# Patient Record
Sex: Female | Born: 1990 | Race: White | Hispanic: No | Marital: Married | Smoking: Never smoker
Health system: Southern US, Community
[De-identification: ages and names within clinical notes are randomized; demographics above are authoritative.]

## PROBLEM LIST (undated history)

## (undated) DIAGNOSIS — F338 Other recurrent depressive disorders: Secondary | ICD-10-CM

## (undated) DIAGNOSIS — G4489 Other headache syndrome: Secondary | ICD-10-CM

## (undated) DIAGNOSIS — G43909 Migraine, unspecified, not intractable, without status migrainosus: Secondary | ICD-10-CM

## (undated) DIAGNOSIS — F32A Depression, unspecified: Secondary | ICD-10-CM

## (undated) DIAGNOSIS — F419 Anxiety disorder, unspecified: Secondary | ICD-10-CM

## (undated) DIAGNOSIS — R102 Pelvic and perineal pain unspecified side: Secondary | ICD-10-CM

## (undated) DIAGNOSIS — K219 Gastro-esophageal reflux disease without esophagitis: Secondary | ICD-10-CM

## (undated) DIAGNOSIS — R5383 Other fatigue: Secondary | ICD-10-CM

## (undated) DIAGNOSIS — N809 Endometriosis, unspecified: Secondary | ICD-10-CM

## (undated) DIAGNOSIS — R002 Palpitations: Secondary | ICD-10-CM

## (undated) DIAGNOSIS — K589 Irritable bowel syndrome without diarrhea: Secondary | ICD-10-CM

## (undated) DIAGNOSIS — M419 Scoliosis, unspecified: Secondary | ICD-10-CM

## (undated) DIAGNOSIS — E119 Type 2 diabetes mellitus without complications: Secondary | ICD-10-CM

## (undated) DIAGNOSIS — E282 Polycystic ovarian syndrome: Secondary | ICD-10-CM

## (undated) DIAGNOSIS — F329 Major depressive disorder, single episode, unspecified: Secondary | ICD-10-CM

## (undated) DIAGNOSIS — Z973 Presence of spectacles and contact lenses: Secondary | ICD-10-CM

## (undated) DIAGNOSIS — K900 Celiac disease: Secondary | ICD-10-CM

## (undated) DIAGNOSIS — D509 Iron deficiency anemia, unspecified: Secondary | ICD-10-CM

## (undated) HISTORY — DX: Type 2 diabetes mellitus without complications: E11.9

## (undated) HISTORY — DX: Other recurrent depressive disorders: F33.8

## (undated) HISTORY — DX: Scoliosis, unspecified: M41.9

## (undated) HISTORY — DX: Iron deficiency anemia, unspecified: D50.9

## (undated) HISTORY — DX: Other fatigue: R53.83

## (undated) HISTORY — DX: Anxiety disorder, unspecified: F41.9

## (undated) HISTORY — DX: Depression, unspecified: F32.A

## (undated) HISTORY — DX: Migraine, unspecified, not intractable, without status migrainosus: G43.909

## (undated) HISTORY — DX: Major depressive disorder, single episode, unspecified: F32.9

## (undated) HISTORY — DX: Palpitations: R00.2

## (undated) HISTORY — DX: Other headache syndrome: G44.89

## (undated) HISTORY — PX: WISDOM TOOTH EXTRACTION: SHX21

## (undated) HISTORY — PX: CHOLECYSTECTOMY: SHX55

---

## 2010-02-04 NOTE — L&D Delivery Note (Signed)
Delivery Note At 1:58 AM a viable female was delivered via Vaginal, Spontaneous Delivery (Presentation:Vtx, ROA ;  ).  APGAR: 9, 9; weight 7 lb 3.3 oz (3269 g).   Placenta status: Intact, Spontaneous.  Cord: 3 vessels with the following complications: None.  Cord pH: done but couldn't be read  Anesthesia: Local  Episiotomy: None Lacerations: 2nd degree perineal Suture Repair: 2.0 chromic Est. Blood Loss (mL):   Mom to postpartum.  Baby to nursery-stable.  Aleisa Howk C 10/31/2010, 2:23 AM

## 2010-04-03 LAB — HIV ANTIBODY (ROUTINE TESTING W REFLEX): HIV: NONREACTIVE

## 2010-04-03 LAB — ABO/RH: RH Type: POSITIVE

## 2010-04-03 LAB — CBC: Platelets: 269 10*3/uL (ref 150–399)

## 2010-04-03 LAB — GC/CHLAMYDIA PROBE AMP, GENITAL: Gonorrhea: NEGATIVE

## 2010-07-13 DIAGNOSIS — O479 False labor, unspecified: Secondary | ICD-10-CM

## 2010-10-26 ENCOUNTER — Inpatient Hospital Stay (HOSPITAL_COMMUNITY)
Admission: AD | Admit: 2010-10-26 | Discharge: 2010-10-26 | Disposition: A | Discharge status: Home / Self Care | Payer: Medicaid Other | Source: Ambulatory Visit | Attending: Obstetrics and Gynecology | Admitting: Obstetrics and Gynecology

## 2010-10-26 ENCOUNTER — Encounter (HOSPITAL_COMMUNITY): Payer: Self-pay

## 2010-10-26 DIAGNOSIS — O479 False labor, unspecified: Secondary | ICD-10-CM

## 2010-10-26 DIAGNOSIS — O4700 False labor before 37 completed weeks of gestation, unspecified trimester: Secondary | ICD-10-CM | POA: Insufficient documentation

## 2010-10-26 NOTE — Progress Notes (Signed)
Pt state she has been having low back and low abdominal pain today on and off. Has had an increase  In her discharge. Reports good fetal movement.

## 2010-10-26 NOTE — ED Notes (Signed)
Malcolm Metro CNM in room with pt

## 2010-10-26 NOTE — ED Provider Notes (Signed)
Chief Complaint:  Abdominal Pain   Stephanie Acosta is  20 y.o. G1P0. [redacted]w[redacted]d She presents complaining of Abdominal Pain and increased mucous discharge. States she called the triage nurse at MD office and was instructed to come to MAU for evaluation.  Obstetrical/Gynecological History: OB History    Grav Para Term Preterm Abortions TAB SAB Ect Mult Living   1               Past Medical History: Past Medical History  Diagnosis Date  . No pertinent past medical history     Past Surgical History: Past Surgical History  Procedure Date  . No past surgeries     Family History: Family History  Problem Relation Age of Onset  . Diabetes Maternal Grandfather   . Diabetes Paternal Grandmother     Social History: History  Substance Use Topics  . Smoking status: Never Smoker   . Smokeless tobacco: Not on file  . Alcohol Use: No    Allergies:  Allergies  Allergen Reactions  . Sulfa Antibiotics Rash    Childhood reaction    Prescriptions prior to admission  Medication Sig Dispense Refill  . omeprazole (PRILOSEC OTC) 20 MG tablet Take 20 mg by mouth at bedtime.        . prenatal vitamin w/FE, FA (PRENATAL 1 + 1) 27-1 MG TABS Take 1 tablet by mouth at bedtime.          Review of Systems - Negative except what has been reviewed in the HPI  Physical Exam   Blood pressure 122/75, pulse 93, temperature 98.2 F (36.8 C), temperature source Oral, resp. rate 20, height 5' 4"  (1.626 m), weight 86.909 kg (191 lb 9.6 oz), SpO2 97.00%.  General: General appearance - alert, well appearing, and in no distress, oriented to person, place, and time and overweight Mental status - alert, oriented to person, place, and time, normal mood, behavior, speech, dress, motor activity, and thought processes, affect appropriate to mood Focused Gynecological Exam: CERVIX: 1.5/50/soft/vtx/-3 + mucous on glove with exam, c/w loss of plug  MD Consult: Discussed with Dr. TGertie Fey Assessment: G1P0 @  351w4dalse Labor Fetal testing c/w exam  Plan: Discharge home with labor instructions  Oakes Mccready E. 10/26/2010,4:26 PM

## 2010-10-31 ENCOUNTER — Encounter (HOSPITAL_COMMUNITY): Payer: Self-pay | Admitting: *Deleted

## 2010-10-31 ENCOUNTER — Inpatient Hospital Stay (HOSPITAL_COMMUNITY)
Admission: AD | Admit: 2010-10-31 | Discharge: 2010-11-01 | DRG: 775 | Disposition: A | Discharge status: Home / Self Care | Payer: Managed Care, Other (non HMO) | Source: Ambulatory Visit | Attending: Obstetrics and Gynecology | Admitting: Obstetrics and Gynecology

## 2010-10-31 LAB — ABO/RH: ABO/RH(D): O POS

## 2010-10-31 LAB — STREP B DNA PROBE: GBS: NEGATIVE

## 2010-10-31 LAB — CBC
HCT: 33.9 % — ABNORMAL LOW (ref 36.0–46.0)
Hemoglobin: 10.8 g/dL — ABNORMAL LOW (ref 12.0–15.0)
WBC: 15.3 10*3/uL — ABNORMAL HIGH (ref 4.0–10.5)

## 2010-10-31 LAB — RPR: RPR Ser Ql: NONREACTIVE

## 2010-10-31 MED ORDER — FLEET ENEMA 7-19 GM/118ML RE ENEM: 1.0000 | ENEMA | RECTAL | Status: DC | PRN

## 2010-10-31 MED ORDER — ONDANSETRON HCL 4 MG PO TABS: 4.0000 mg | ORAL_TABLET | ORAL | Status: DC | PRN

## 2010-10-31 MED ORDER — SENNOSIDES-DOCUSATE SODIUM 8.6-50 MG PO TABS
2.0000 | ORAL_TABLET | Freq: Every day | ORAL | Status: DC
  Administered 2010-10-31: 2 via ORAL

## 2010-10-31 MED ORDER — DIBUCAINE 1 % RE OINT: 1.0000 "application " | TOPICAL_OINTMENT | RECTAL | Status: DC | PRN

## 2010-10-31 MED ORDER — BENZOCAINE-MENTHOL 20-0.5 % EX AERO
INHALATION_SPRAY | CUTANEOUS | Status: AC
  Administered 2010-10-31: 07:00:00
  Filled 2010-10-31: qty 56

## 2010-10-31 MED ORDER — LIDOCAINE HCL (PF) 1 % IJ SOLN
30.0000 mL | INTRAMUSCULAR | Status: DC | PRN
  Administered 2010-10-31: 30 mL via SUBCUTANEOUS

## 2010-10-31 MED ORDER — LANOLIN HYDROUS EX OINT: TOPICAL_OINTMENT | CUTANEOUS | Status: DC | PRN

## 2010-10-31 MED ORDER — OXYTOCIN 20 UNITS IN LACTATED RINGERS INFUSION - SIMPLE
INTRAVENOUS | Status: AC
  Filled 2010-10-31: qty 1000

## 2010-10-31 MED ORDER — IBUPROFEN 600 MG PO TABS
600.0000 mg | ORAL_TABLET | Freq: Four times a day (QID) | ORAL | Status: DC
  Administered 2010-10-31 – 2010-11-01 (×4): 600 mg via ORAL
  Filled 2010-10-31 (×5): qty 1

## 2010-10-31 MED ORDER — OXYTOCIN BOLUS FROM INFUSION
500.0000 mL | Freq: Once | INTRAVENOUS | Status: AC
  Administered 2010-10-31: 500 mL via INTRAVENOUS
  Filled 2010-10-31: qty 500

## 2010-10-31 MED ORDER — DIPHENHYDRAMINE HCL 25 MG PO CAPS: 25.0000 mg | ORAL_CAPSULE | Freq: Four times a day (QID) | ORAL | Status: DC | PRN

## 2010-10-31 MED ORDER — SIMETHICONE 80 MG PO CHEW: 80.0000 mg | CHEWABLE_TABLET | ORAL | Status: DC | PRN

## 2010-10-31 MED ORDER — ACETAMINOPHEN 325 MG PO TABS: 650.0000 mg | ORAL_TABLET | ORAL | Status: DC | PRN

## 2010-10-31 MED ORDER — PRENATAL PLUS 27-1 MG PO TABS
1.0000 | ORAL_TABLET | Freq: Every day | ORAL | Status: DC
  Administered 2010-10-31 – 2010-11-01 (×2): 1 via ORAL
  Filled 2010-10-31 (×2): qty 1

## 2010-10-31 MED ORDER — OXYCODONE-ACETAMINOPHEN 5-325 MG PO TABS: 1.0000 | ORAL_TABLET | ORAL | Status: DC | PRN

## 2010-10-31 MED ORDER — IBUPROFEN 600 MG PO TABS
600.0000 mg | ORAL_TABLET | Freq: Four times a day (QID) | ORAL | Status: DC | PRN
  Administered 2010-10-31: 600 mg via ORAL
  Filled 2010-10-31: qty 1

## 2010-10-31 MED ORDER — LACTATED RINGERS IV SOLN
INTRAVENOUS | Status: DC
  Administered 2010-10-31: 02:00:00 via INTRAVENOUS

## 2010-10-31 MED ORDER — BENZOCAINE-MENTHOL 20-0.5 % EX AERO: 1.0000 "application " | INHALATION_SPRAY | CUTANEOUS | Status: DC | PRN

## 2010-10-31 MED ORDER — OXYCODONE-ACETAMINOPHEN 5-325 MG PO TABS: 2.0000 | ORAL_TABLET | ORAL | Status: DC | PRN

## 2010-10-31 MED ORDER — CITRIC ACID-SODIUM CITRATE 334-500 MG/5ML PO SOLN: 30.0000 mL | ORAL | Status: DC | PRN

## 2010-10-31 MED ORDER — ONDANSETRON HCL 4 MG/2ML IJ SOLN: 4.0000 mg | Freq: Four times a day (QID) | INTRAMUSCULAR | Status: DC | PRN

## 2010-10-31 MED ORDER — ONDANSETRON HCL 4 MG/2ML IJ SOLN: 4.0000 mg | INTRAMUSCULAR | Status: DC | PRN

## 2010-10-31 MED ORDER — MEDROXYPROGESTERONE ACETATE 150 MG/ML IM SUSP: 150.0000 mg | INTRAMUSCULAR | Status: DC | PRN

## 2010-10-31 MED ORDER — LIDOCAINE HCL (PF) 1 % IJ SOLN
INTRAMUSCULAR | Status: AC
  Filled 2010-10-31: qty 30

## 2010-10-31 MED ORDER — ZOLPIDEM TARTRATE 5 MG PO TABS: 5.0000 mg | ORAL_TABLET | Freq: Every evening | ORAL | Status: DC | PRN

## 2010-10-31 MED ORDER — MEASLES, MUMPS & RUBELLA VAC ~~LOC~~ INJ
0.5000 mL | INJECTION | Freq: Once | SUBCUTANEOUS | Status: AC
  Administered 2010-11-01: 0.5 mL via SUBCUTANEOUS
  Filled 2010-10-31 (×2): qty 0.5

## 2010-10-31 MED ORDER — WITCH HAZEL-GLYCERIN EX PADS: 1.0000 "application " | MEDICATED_PAD | CUTANEOUS | Status: DC | PRN

## 2010-10-31 MED ORDER — OXYTOCIN 20 UNITS IN LACTATED RINGERS INFUSION - SIMPLE
125.0000 mL/h | Freq: Once | INTRAVENOUS | Status: AC
  Administered 2010-10-31: 125 mL/h via INTRAVENOUS

## 2010-10-31 MED ORDER — TETANUS-DIPHTH-ACELL PERTUSSIS 5-2.5-18.5 LF-MCG/0.5 IM SUSP
0.5000 mL | Freq: Once | INTRAMUSCULAR | Status: AC
  Administered 2010-11-01: 0.5 mL via INTRAMUSCULAR
  Filled 2010-10-31: qty 0.5

## 2010-10-31 MED ORDER — LACTATED RINGERS IV SOLN: 500.0000 mL | INTRAVENOUS | Status: DC | PRN

## 2010-10-31 NOTE — Progress Notes (Signed)
UR chart review completed.  

## 2010-10-31 NOTE — Progress Notes (Signed)
Post Partum Day 0 Subjective: no complaints, up ad lib and voiding  Objective: Blood pressure 111/73, pulse 90, temperature 98.7 F (37.1 C), temperature source Oral, resp. rate 18, height 5' 4"  (1.626 m), weight 85.276 kg (188 lb), SpO2 100.00%, unknown if currently breastfeeding.  Physical Exam:  General: alert and cooperative Lochia: appropriate Uterine Fundus: firm Incision: healing well DVT Evaluation: No evidence of DVT seen on physical exam.   Basename 10/31/10 0150  HGB 10.8*  HCT 33.9*    Assessment/Plan: Plan for discharge tomorrow   LOS: 0 days   Juris Gosnell G 10/31/2010, 8:27 AM

## 2010-10-31 NOTE — Progress Notes (Signed)
Pt states water broke a little before midnight

## 2010-10-31 NOTE — H&P (Signed)
Stephanie Acosta is a 20 y.o. female presenting for evaluation of labor with SROM clear fluid at 1200.  Pregnancy uncomplicated.  GBS -. History OB History    Grav Para Term Preterm Abortions TAB SAB Ect Mult Living   1 0 0 0 0 0 0 0 0 0      Past Medical History  Diagnosis Date  . No pertinent past medical history    Past Surgical History  Procedure Date  . No past surgeries    Family History: family history includes Diabetes in her maternal grandfather and paternal grandmother. Social History:  reports that she has never smoked. She does not have any smokeless tobacco history on file. She reports that she does not drink alcohol or use illicit drugs.  ROS  Dilation: 8.5 Effacement (%): 90 Station: -1 Exam by:: K. Randon Goldsmith RN Blood pressure 135/90, pulse 84, temperature 97.6 F (36.4 C), temperature source Oral, resp. rate 18, height 5' 4"  (1.626 m), weight 85.276 kg (188 lb). Exam Physical Exam  Prenatal labs: ABO, Rh:   Antibody:   Rubella:   RPR:    HBsAg:    HIV:    GBS:     Assessment/Plan: IUP at term in active labor.   Anticipate SVD   Thuy Atilano C 10/31/2010, 2:22 AM

## 2010-10-31 NOTE — Progress Notes (Signed)
10/31/10 0258  Provider Notification  Provider Name/Title Luz Lex, MD  Method of Notification Phone  No prenatal record found, provider called.  Provider stated to call office for record in morning

## 2010-10-31 NOTE — Progress Notes (Signed)
Contractions, leaking fluid (clear fluid) since 12 mn. Denies problems with pregnancy

## 2010-11-01 LAB — CBC
HCT: 28.7 % — ABNORMAL LOW (ref 36.0–46.0)
MCV: 82.9 fL (ref 78.0–100.0)
Platelets: 162 10*3/uL (ref 150–400)
RBC: 3.46 MIL/uL — ABNORMAL LOW (ref 3.87–5.11)
WBC: 12 10*3/uL — ABNORMAL HIGH (ref 4.0–10.5)

## 2010-11-01 MED ORDER — IBUPROFEN 600 MG PO TABS: 600.0000 mg | ORAL_TABLET | Freq: Four times a day (QID) | ORAL | Status: AC

## 2010-11-01 MED ORDER — INFLUENZA VIRUS VACC SPLIT PF IM SUSP
0.5000 mL | Freq: Once | INTRAMUSCULAR | Status: AC
  Administered 2010-11-01: 0.5 mL via INTRAMUSCULAR
  Filled 2010-11-01: qty 0.5

## 2010-11-01 NOTE — Discharge Summary (Signed)
Obstetric Discharge Summary Reason for Admission: rupture of membranes Prenatal Procedures: none Intrapartum Procedures: spontaneous vaginal delivery Postpartum Procedures: none Complications-Operative and Postpartum: 2 degree perineal laceration Hemoglobin  Date Value Range Status  11/01/2010 9.0* 12.0-15.0 (g/dL) Final     HCT  Date Value Range Status  11/01/2010 28.7* 36.0-46.0 (%) Final    Discharge Diagnoses: Term Pregnancy-delivered  Discharge Information: Date: 11/01/2010 Activity: pelvic rest Diet: routine Medications: PNV and Ibuprofen Condition: stable Instructions: refer to practice specific booklet Discharge to: home   Newborn Data: Live born female  Birth Weight: 7 lb 3.3 oz (3269 g) APGAR: 9, 9  Home with mother.  Stephanie Acosta G 11/01/2010, 9:07 AM

## 2010-11-01 NOTE — Progress Notes (Signed)
Post Partum Day 1 Subjective: no complaints, up ad lib, voiding and + flatus. Desires early discharge  Objective: Blood pressure 110/71, pulse 80, temperature 98 F (36.7 C), temperature source Oral, resp. rate 18, height 5' 4"  (1.626 m), weight 85.276 kg (188 lb), SpO2 100.00%, unknown if currently breastfeeding.  Physical Exam:  General: alert and cooperative Lochia: appropriate Uterine Fundus: firm Perineum intact DVT Evaluation: No evidence of DVT seen on physical exam.   Basename 11/01/10 0548 10/31/10 0150  HGB 9.0* 10.8*  HCT 28.7* 33.9*    Assessment/Plan: Discharge home   LOS: 1 day   Stephanie Acosta G 11/01/2010, 8:59 AM

## 2011-02-05 DIAGNOSIS — E282 Polycystic ovarian syndrome: Secondary | ICD-10-CM

## 2011-02-05 HISTORY — DX: Polycystic ovarian syndrome: E28.2

## 2011-10-22 ENCOUNTER — Other Ambulatory Visit: Payer: Self-pay | Admitting: Gastroenterology

## 2011-10-22 DIAGNOSIS — R1033 Periumbilical pain: Secondary | ICD-10-CM

## 2011-10-22 DIAGNOSIS — R110 Nausea: Secondary | ICD-10-CM

## 2011-11-06 ENCOUNTER — Ambulatory Visit (HOSPITAL_COMMUNITY)
Admission: RE | Admit: 2011-11-06 | Discharge: 2011-11-06 | Disposition: A | Discharge status: Home / Self Care | Payer: Federal, State, Local not specified - PPO | Source: Ambulatory Visit | Attending: Gastroenterology | Admitting: Gastroenterology

## 2011-11-06 ENCOUNTER — Encounter (HOSPITAL_COMMUNITY)
Admission: RE | Admit: 2011-11-06 | Discharge: 2011-11-06 | Disposition: A | Discharge status: Home / Self Care | Payer: Federal, State, Local not specified - PPO | Source: Ambulatory Visit | Attending: Gastroenterology | Admitting: Gastroenterology

## 2011-11-06 ENCOUNTER — Encounter (HOSPITAL_COMMUNITY): Payer: Self-pay

## 2011-11-06 DIAGNOSIS — K7689 Other specified diseases of liver: Secondary | ICD-10-CM | POA: Insufficient documentation

## 2011-11-06 DIAGNOSIS — R110 Nausea: Secondary | ICD-10-CM | POA: Insufficient documentation

## 2011-11-06 DIAGNOSIS — R1033 Periumbilical pain: Secondary | ICD-10-CM

## 2011-11-06 MED ORDER — TECHNETIUM TC 99M MEBROFENIN IV KIT
4.8000 | PACK | Freq: Once | INTRAVENOUS | Status: AC | PRN
  Administered 2011-11-06: 4.8 via INTRAVENOUS

## 2012-07-07 ENCOUNTER — Encounter (HOSPITAL_COMMUNITY): Payer: Self-pay | Admitting: Obstetrics and Gynecology

## 2012-07-08 ENCOUNTER — Encounter (HOSPITAL_COMMUNITY): Payer: Self-pay | Admitting: Pharmacy Technician

## 2012-07-08 NOTE — H&P (Signed)
Stephanie Acosta  DICTATION # [id number] CSN# [id number]   Margarette Asal, MD 07/08/2012 9:30 AM

## 2012-07-08 NOTE — H&P (Signed)
Stephanie Acosta, Stephanie Acosta             ACCOUNT NO.:  [id number]  MEDICAL RECORD NO.:  [id number]  LOCATION:  PERIO                         FACILITY:  [facility]  PHYSICIAN:  Ralene Bathe. Matthew Saras, M.D.DATE OF BIRTH:  [birth year]  DATE OF ADMISSION:  07/09/2012 DATE OF DISCHARGE:                             HISTORY & PHYSICAL   CHIEF COMPLAINT:  Missed AB.  HISTORY OF PRESENT ILLNESS:  A 22 year old, G2, P59, who delivered her 1st child in 2012.  She had placement of IUD at that time, position verified and has essentially had no difficulties until recently when she began to experience some daily bleeding.  Her 1st pregnancy in 2012, she conceived while on OCPs.  Examination in the office last week, verified a positive UPT. Ultrasound showed the IUD was in its correct position by ultrasound parameters.  P4 was suboptimal and QhCG did show initially some slight raise, but then the hCG started to drop.  Small sac was identified inside the uterus after the IUD was pulled approximately a week later, she began to experience some spotting.  Repeat ultrasound showed that the sac had flattened.  She prefers not to continue monitoring QhCGs to see if she is spontaneously miscarries, but presents at this time for D and E.  This procedure including specific risks related to bleeding, infection, other complications may require additional surgery reviewed with her, which she understands and accepts.  PAST MEDICAL HISTORY:  Allergies, sulfa.  FAMILY HISTORY:  Significant for a history of Crohn's disease, hypothyroidism, diabetes, anemia, chronic hypertension, heart disease, Alzheimer's, depression, bipolar disease.  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  PHYSICAL EXAM:  VITAL SIGNS:  Temp 98.2, blood pressure 120/78. HEENT:  Unremarkable. NECK:  Supple without masses. LUNGS:  Clear. CARDIOVASCULAR:  Regular rate and rhythm without  murmurs, rubs, gallops. BREASTS:  Without masses. ABDOMEN:  Soft, flat, nontender. PELVIC:  Normal external genitalia.  Vagina and cervix clear.  The cervix was closed.  Minimal spotting.  Uterus itself normal size, nontender.  Adnexa unremarkable. EXTREMITIES:  Unremarkable. NEUROLOGIC:  Unremarkable.  IMPRESSION:  Pregnancy with intrauterine device in place, the intrauterine device has been removed, missed abortion.  PLAN:  D and E procedure and risks discussed as above.     Yunique Dearcos M. Matthew Saras, M.D.     [facility]MED[facility]  D:  07/08/2012  T:  07/08/2012  Job:  [id number]

## 2012-07-09 ENCOUNTER — Ambulatory Visit (HOSPITAL_COMMUNITY): Payer: Federal, State, Local not specified - PPO | Admitting: Anesthesiology

## 2012-07-09 ENCOUNTER — Encounter (HOSPITAL_COMMUNITY): Payer: Self-pay | Admitting: Anesthesiology

## 2012-07-09 ENCOUNTER — Encounter (HOSPITAL_COMMUNITY)
Admission: RE | Disposition: A | Discharge status: Home / Self Care | Payer: Self-pay | Source: Ambulatory Visit | Attending: Obstetrics and Gynecology

## 2012-07-09 ENCOUNTER — Ambulatory Visit (HOSPITAL_COMMUNITY)
Admission: RE | Admit: 2012-07-09 | Discharge: 2012-07-09 | Disposition: A | Discharge status: Home / Self Care | Payer: Federal, State, Local not specified - PPO | Source: Ambulatory Visit | Attending: Obstetrics and Gynecology | Admitting: Obstetrics and Gynecology

## 2012-07-09 DIAGNOSIS — O021 Missed abortion: Secondary | ICD-10-CM | POA: Insufficient documentation

## 2012-07-09 HISTORY — PX: DILATION AND EVACUATION: SHX1459

## 2012-07-09 LAB — CBC
HCT: 37.1 % (ref 36.0–46.0)
MCH: 28.1 pg (ref 26.0–34.0)
MCHC: 32.3 g/dL (ref 30.0–36.0)
RDW: 12.5 % (ref 11.5–15.5)

## 2012-07-09 SURGERY — DILATION AND EVACUATION, UTERUS
Anesthesia: Monitor Anesthesia Care | Site: Vagina | Wound class: Clean Contaminated

## 2012-07-09 MED ORDER — ONDANSETRON HCL 4 MG/2ML IJ SOLN
INTRAMUSCULAR | Status: DC | PRN
  Administered 2012-07-09: 4 mg via INTRAVENOUS

## 2012-07-09 MED ORDER — KETOROLAC TROMETHAMINE 30 MG/ML IJ SOLN
INTRAMUSCULAR | Status: AC
  Filled 2012-07-09: qty 1

## 2012-07-09 MED ORDER — LIDOCAINE HCL 1 % IJ SOLN
INTRAMUSCULAR | Status: DC | PRN
  Administered 2012-07-09: 10 mL

## 2012-07-09 MED ORDER — PROPOFOL 10 MG/ML IV BOLUS
INTRAVENOUS | Status: DC | PRN
  Administered 2012-07-09: 20 mg via INTRAVENOUS
  Administered 2012-07-09: 40 mg via INTRAVENOUS
  Administered 2012-07-09 (×3): 20 mg via INTRAVENOUS
  Administered 2012-07-09: 30 mg via INTRAVENOUS
  Administered 2012-07-09: 20 mg via INTRAVENOUS
  Administered 2012-07-09 (×2): 10 mg via INTRAVENOUS
  Administered 2012-07-09 (×3): 20 mg via INTRAVENOUS

## 2012-07-09 MED ORDER — FENTANYL CITRATE 0.05 MG/ML IJ SOLN: 25.0000 ug | INTRAMUSCULAR | Status: DC | PRN

## 2012-07-09 MED ORDER — FENTANYL CITRATE 0.05 MG/ML IJ SOLN
INTRAMUSCULAR | Status: DC | PRN
  Administered 2012-07-09 (×2): 50 ug via INTRAVENOUS

## 2012-07-09 MED ORDER — MIDAZOLAM HCL 5 MG/5ML IJ SOLN
INTRAMUSCULAR | Status: DC | PRN
  Administered 2012-07-09: 2 mg via INTRAVENOUS

## 2012-07-09 MED ORDER — LIDOCAINE HCL (CARDIAC) 20 MG/ML IV SOLN
INTRAVENOUS | Status: DC | PRN
  Administered 2012-07-09: 20 mg via INTRAVENOUS

## 2012-07-09 MED ORDER — ONDANSETRON HCL 4 MG/2ML IJ SOLN
INTRAMUSCULAR | Status: AC
  Filled 2012-07-09: qty 2

## 2012-07-09 MED ORDER — PROPOFOL 10 MG/ML IV EMUL
INTRAVENOUS | Status: AC
  Filled 2012-07-09: qty 20

## 2012-07-09 MED ORDER — FENTANYL CITRATE 0.05 MG/ML IJ SOLN
INTRAMUSCULAR | Status: AC
  Filled 2012-07-09: qty 2

## 2012-07-09 MED ORDER — KETOROLAC TROMETHAMINE 30 MG/ML IJ SOLN
INTRAMUSCULAR | Status: DC | PRN
  Administered 2012-07-09: 30 mg via INTRAVENOUS

## 2012-07-09 MED ORDER — MIDAZOLAM HCL 2 MG/2ML IJ SOLN
INTRAMUSCULAR | Status: AC
  Filled 2012-07-09: qty 2

## 2012-07-09 MED ORDER — LIDOCAINE HCL (CARDIAC) 20 MG/ML IV SOLN
INTRAVENOUS | Status: AC
  Filled 2012-07-09: qty 5

## 2012-07-09 MED ORDER — KETOROLAC TROMETHAMINE 30 MG/ML IJ SOLN: 15.0000 mg | Freq: Once | INTRAMUSCULAR | Status: DC | PRN

## 2012-07-09 MED ORDER — FENTANYL CITRATE 0.05 MG/ML IJ SOLN
INTRAMUSCULAR | Status: AC
  Administered 2012-07-09: 50 ug via INTRAVENOUS
  Filled 2012-07-09: qty 2

## 2012-07-09 MED ORDER — HYDROCODONE-IBUPROFEN 7.5-200 MG PO TABS: 1.0000 | ORAL_TABLET | Freq: Three times a day (TID) | ORAL | Status: DC | PRN

## 2012-07-09 MED ORDER — LACTATED RINGERS IV SOLN
INTRAVENOUS | Status: DC
  Administered 2012-07-09: 12:00:00 via INTRAVENOUS

## 2012-07-09 SURGICAL SUPPLY — 18 items
CATH ROBINSON RED A/P 16FR (CATHETERS) ×2 IMPLANT
CLOTH BEACON ORANGE TIMEOUT ST (SAFETY) ×2 IMPLANT
DECANTER SPIKE VIAL GLASS SM (MISCELLANEOUS) ×2 IMPLANT
GLOVE BIO SURGEON STRL SZ7 (GLOVE) ×2 IMPLANT
GOWN STRL REIN XL XLG (GOWN DISPOSABLE) ×4 IMPLANT
KIT BERKELEY 1ST TRIMESTER 3/8 (MISCELLANEOUS) ×2 IMPLANT
NEEDLE SPNL 22GX3.5 QUINCKE BK (NEEDLE) ×2 IMPLANT
NS IRRIG 1000ML POUR BTL (IV SOLUTION) ×2 IMPLANT
PACK VAGINAL MINOR WOMEN LF (CUSTOM PROCEDURE TRAY) ×2 IMPLANT
PAD OB MATERNITY 4.3X12.25 (PERSONAL CARE ITEMS) ×2 IMPLANT
PAD PREP 24X48 CUFFED NSTRL (MISCELLANEOUS) ×2 IMPLANT
SET BERKELEY SUCTION TUBING (SUCTIONS) ×2 IMPLANT
SYR CONTROL 10ML LL (SYRINGE) ×2 IMPLANT
TOWEL OR 17X24 6PK STRL BLUE (TOWEL DISPOSABLE) ×4 IMPLANT
VACURETTE 10 RIGID CVD (CANNULA) IMPLANT
VACURETTE 7MM CVD STRL WRAP (CANNULA) ×2 IMPLANT
VACURETTE 8 RIGID CVD (CANNULA) IMPLANT
VACURETTE 9 RIGID CVD (CANNULA) IMPLANT

## 2012-07-09 NOTE — Transfer of Care (Signed)
Immediate Anesthesia Transfer of Care Note  Patient: Stephanie Acosta  Procedure(s) Performed: Procedure(s): DILATATION AND EVACUATION (N/A)  Patient Location: PACU  Anesthesia Type:MAC  Level of Consciousness: awake, sedated and patient cooperative  Airway & Oxygen Therapy: Patient Spontanous Breathing  Post-op Assessment: Report given to PACU RN and Post -op Vital signs reviewed and stable  Post vital signs: Reviewed and stable  Complications: No apparent anesthesia complications

## 2012-07-09 NOTE — Progress Notes (Signed)
The patient was re-examined with no change in status 

## 2012-07-09 NOTE — Op Note (Signed)
Preoperative diagnosis: Missed AB  Postoperative diagnosis: Same  Procedure: Suction D&E  Surgeon: Matthew Saras  Anesthesia: MAC plus paracervical block  Specimens removed: Products of conception, to pathology  Procedure and findings:  The patient taken the operating room after an adequate level of sedation was obtained with leg stirrups the perineum and vagina prepped and in the usual fashion for D&E. The bladder was drained he UA carried out uterus was midposition upper limit normal size adnexa negative. Appropriate timeout taken at that point. Speculum was positioned cervix grasped with tenaculum paracervical block was then created by infiltrating at 3 and 9:00 submucosally, 5-7 cc 1% Xylocaine plain at each site after negative aspiration. The uterus was sounded to 8-9 cm progressively dilated to a 27 Pratt, a #7 curved suction curet was then used to curette a moderate amount of tissue. When no further tissue could be removed, small curette was used to explore the cavity revealing it to be clean there was minimal bleeding she tolerated this well went to recovery room in good condition.  Dictated with [facility]  Rilan Eiland M. Garry Heater.D.

## 2012-07-09 NOTE — Anesthesia Postprocedure Evaluation (Signed)
  Anesthesia Post Note  Patient: Stephanie Acosta  Procedure(s) Performed: Procedure(s) (LRB): DILATATION AND EVACUATION (N/A)  Anesthesia type: MAC  Patient location: PACU  Post pain: Pain level controlled  Post assessment: Post-op Vital signs reviewed  Last Vitals:  Filed Vitals:   07/09/12 1419  BP:   Pulse: 78  Temp: 36.6 C  Resp: 16    Post vital signs: Reviewed  Level of consciousness: sedated  Complications: No apparent anesthesia complications

## 2012-07-09 NOTE — Anesthesia Preprocedure Evaluation (Signed)
Anesthesia Evaluation  Patient identified by MRN, date of birth, ID band Patient awake    Reviewed: Allergy & Precautions, H&P , NPO status , Patient's Chart, lab work & pertinent test results, reviewed documented beta blocker date and time   History of Anesthesia Complications Negative for: history of anesthetic complications  Airway Mallampati: II      Dental  (+) Teeth Intact   Pulmonary neg pulmonary ROS,  breath sounds clear to auscultation  Pulmonary exam normal       Cardiovascular negative cardio ROS  Rhythm:regular Rate:Normal     Neuro/Psych negative neurological ROS  negative psych ROS   GI/Hepatic negative GI ROS, Neg liver ROS,   Endo/Other  negative endocrine ROS  Renal/GU negative Renal ROS  negative genitourinary   Musculoskeletal   Abdominal   Peds  Hematology negative hematology ROS (+)   Anesthesia Other Findings   Reproductive/Obstetrics (+) Pregnancy (missed ab)                           Anesthesia Physical Anesthesia Plan  ASA: II  Anesthesia Plan: MAC   Post-op Pain Management:    Induction:   Airway Management Planned:   Additional Equipment:   Intra-op Plan:   Post-operative Plan:   Informed Consent: I have reviewed the patients History and Physical, chart, labs and discussed the procedure including the risks, benefits and alternatives for the proposed anesthesia with the patient or authorized representative who has indicated his/her understanding and acceptance.   Dental Advisory Given  Plan Discussed with: CRNA and Surgeon  Anesthesia Plan Comments:         Anesthesia Quick Evaluation

## 2012-07-14 ENCOUNTER — Encounter (HOSPITAL_COMMUNITY): Payer: Self-pay | Admitting: Obstetrics and Gynecology

## 2012-09-29 LAB — OB RESULTS CONSOLE HEPATITIS B SURFACE ANTIGEN: HEP B S AG: NEGATIVE

## 2012-09-29 LAB — OB RESULTS CONSOLE HIV ANTIBODY (ROUTINE TESTING): HIV: NONREACTIVE

## 2012-09-29 LAB — OB RESULTS CONSOLE RPR: RPR: NONREACTIVE

## 2012-09-29 LAB — OB RESULTS CONSOLE ANTIBODY SCREEN: Antibody Screen: NEGATIVE

## 2012-09-29 LAB — OB RESULTS CONSOLE ABO/RH: RH TYPE: POSITIVE

## 2012-09-29 LAB — OB RESULTS CONSOLE GC/CHLAMYDIA
Chlamydia: NEGATIVE
Gonorrhea: NEGATIVE

## 2012-09-29 LAB — OB RESULTS CONSOLE RUBELLA ANTIBODY, IGM: Rubella: IMMUNE

## 2012-10-19 ENCOUNTER — Other Ambulatory Visit: Payer: Self-pay

## 2013-01-08 ENCOUNTER — Encounter (HOSPITAL_COMMUNITY): Payer: Self-pay | Admitting: Obstetrics & Gynecology

## 2013-01-15 ENCOUNTER — Other Ambulatory Visit (HOSPITAL_COMMUNITY): Payer: Self-pay | Admitting: Obstetrics & Gynecology

## 2013-01-15 DIAGNOSIS — Q270 Congenital absence and hypoplasia of umbilical artery: Secondary | ICD-10-CM

## 2013-01-19 ENCOUNTER — Encounter (HOSPITAL_COMMUNITY): Payer: Self-pay

## 2013-01-19 ENCOUNTER — Ambulatory Visit (HOSPITAL_COMMUNITY)
Admission: RE | Admit: 2013-01-19 | Discharge: 2013-01-19 | Disposition: A | Discharge status: Home / Self Care | Payer: Federal, State, Local not specified - PPO | Source: Ambulatory Visit | Attending: Obstetrics & Gynecology | Admitting: Obstetrics & Gynecology

## 2013-01-19 DIAGNOSIS — Z1389 Encounter for screening for other disorder: Secondary | ICD-10-CM | POA: Insufficient documentation

## 2013-01-19 DIAGNOSIS — Q270 Congenital absence and hypoplasia of umbilical artery: Secondary | ICD-10-CM

## 2013-01-19 DIAGNOSIS — IMO0001 Reserved for inherently not codable concepts without codable children: Secondary | ICD-10-CM | POA: Insufficient documentation

## 2013-01-19 DIAGNOSIS — Z363 Encounter for antenatal screening for malformations: Secondary | ICD-10-CM | POA: Insufficient documentation

## 2013-01-19 DIAGNOSIS — O358XX0 Maternal care for other (suspected) fetal abnormality and damage, not applicable or unspecified: Secondary | ICD-10-CM | POA: Insufficient documentation

## 2013-01-19 NOTE — Progress Notes (Signed)
Stephanie Acosta  was seen today for an ultrasound appointment.  See full report in [facility].  Impression: Single IUP at 24 2/7 weeks Isolated single umbilical artery The fetal anatomy is otherwise within normal limits Fetal growth is appropriate (42nd %tile) Normal amniotic fluid volume  The findings and limitations of the study were discussed. The patient was offerred a formal fetal echo and declined.  Recommendations: Recommend follow up ultrasound for growth in 4-6 weeks - please contact our office if you would prefer that these procedures be performed here.  Benjaman Lobe, MD

## 2013-02-04 NOTE — L&D Delivery Note (Signed)
Delivery Note At 9:28 AM a viable female was delivered via Vaginal, Spontaneous Delivery .  APGAR: 9 9  Placenta status: Intact, Spontaneous.  Cord:  with the following complications: 2 vessel cord .    Anesthesia:  epidural Episiotomy: none Lacerations: none Suture Repair: 3.0 chromic Est. Blood Loss (mL):   Mom to postpartum.  Baby to Couplet care / Skin to Skin.  Stephanie Acosta L 04/23/2013, 9:45 AM

## 2013-04-18 LAB — OB RESULTS CONSOLE GBS: STREP GROUP B AG: NEGATIVE

## 2013-04-21 ENCOUNTER — Inpatient Hospital Stay (HOSPITAL_COMMUNITY)
Admission: AD | Admit: 2013-04-21 | Discharge: 2013-04-21 | Disposition: A | Discharge status: Home / Self Care | Payer: Federal, State, Local not specified - PPO | Source: Ambulatory Visit | Attending: Obstetrics and Gynecology | Admitting: Obstetrics and Gynecology

## 2013-04-21 ENCOUNTER — Encounter (HOSPITAL_COMMUNITY): Payer: Self-pay | Admitting: *Deleted

## 2013-04-21 DIAGNOSIS — O479 False labor, unspecified: Secondary | ICD-10-CM | POA: Insufficient documentation

## 2013-04-21 NOTE — MAU Note (Signed)
Pt states she has had uc's since 1700, denies any bleeding or LOF.  Had rapid labor with first baby.

## 2013-04-21 NOTE — Discharge Instructions (Signed)
Third Trimester of Pregnancy The third trimester is from week 29 through week 42, months 7 through 9. The third trimester is a time when the fetus is growing rapidly. At the end of the ninth month, the fetus is about 20 inches in length and weighs 6 10 pounds.  BODY CHANGES Your body goes through many changes during pregnancy. The changes vary from woman to woman.   Your weight will continue to increase. You can expect to gain 25 35 pounds (11 16 kg) by the end of the pregnancy.  You may begin to get stretch marks on your hips, abdomen, and breasts.  You may urinate more often because the fetus is moving lower into your pelvis and pressing on your bladder.  You may develop or continue to have heartburn as a result of your pregnancy.  You may develop constipation because certain hormones are causing the muscles that push waste through your intestines to slow down.  You may develop hemorrhoids or swollen, bulging veins (varicose veins).  You may have pelvic pain because of the weight gain and pregnancy hormones relaxing your joints between the bones in your pelvis. Back aches may result from over exertion of the muscles supporting your posture.  Your breasts will continue to grow and be tender. A yellow discharge may leak from your breasts called colostrum.  Your belly button may stick out.  You may feel short of breath because of your expanding uterus.  You may notice the fetus "dropping," or moving lower in your abdomen.  You may have a bloody mucus discharge. This usually occurs a few days to a week before labor begins.  Your cervix becomes thin and soft (effaced) near your due date. WHAT TO EXPECT AT YOUR PRENATAL EXAMS  You will have prenatal exams every 2 weeks until week 36. Then, you will have weekly prenatal exams. During a routine prenatal visit:  You will be weighed to make sure you and the fetus are growing normally.  Your blood pressure is taken.  Your abdomen will be  measured to track your baby's growth.  The fetal heartbeat will be listened to.  Any test results from the previous visit will be discussed.  You may have a cervical check near your due date to see if you have effaced. At around 36 weeks, your caregiver will check your cervix. At the same time, your caregiver will also perform a test on the secretions of the vaginal tissue. This test is to determine if a type of bacteria, Group B streptococcus, is present. Your caregiver will explain this further. Your caregiver may ask you:  What your birth plan is.  How you are feeling.  If you are feeling the baby move.  If you have had any abnormal symptoms, such as leaking fluid, bleeding, severe headaches, or abdominal cramping.  If you have any questions. Other tests or screenings that may be performed during your third trimester include:  Blood tests that check for low iron levels (anemia).  Fetal testing to check the health, activity level, and growth of the fetus. Testing is done if you have certain medical conditions or if there are problems during the pregnancy. FALSE LABOR You may feel small, irregular contractions that eventually go away. These are called Braxton Hicks contractions, or false labor. Contractions may last for hours, days, or even weeks before true labor sets in. If contractions come at regular intervals, intensify, or become painful, it is best to be seen by your caregiver.  SIGNS OF LABOR   Menstrual-like cramps.  Contractions that are 5 minutes apart or less.  Contractions that start on the top of the uterus and spread down to the lower abdomen and back.  A sense of increased pelvic pressure or back pain.  A watery or bloody mucus discharge that comes from the vagina. If you have any of these signs before the 37th week of pregnancy, call your caregiver right away. You need to go to the hospital to get checked immediately. HOME CARE INSTRUCTIONS   Avoid all  smoking, herbs, alcohol, and unprescribed drugs. These chemicals affect the formation and growth of the baby.  Follow your caregiver's instructions regarding medicine use. There are medicines that are either safe or unsafe to take during pregnancy.  Exercise only as directed by your caregiver. Experiencing uterine cramps is a good sign to stop exercising.  Continue to eat regular, healthy meals.  Wear a good support bra for breast tenderness.  Do not use hot tubs, steam rooms, or saunas.  Wear your seat belt at all times when driving.  Avoid raw meat, uncooked cheese, cat litter boxes, and soil used by cats. These carry germs that can cause birth defects in the baby.  Take your prenatal vitamins.  Try taking a stool softener (if your caregiver approves) if you develop constipation. Eat more high-fiber foods, such as fresh vegetables or fruit and whole grains. Drink plenty of fluids to keep your urine clear or pale yellow.  Take warm sitz baths to soothe any pain or discomfort caused by hemorrhoids. Use hemorrhoid cream if your caregiver approves.  If you develop varicose veins, wear support hose. Elevate your feet for 15 minutes, 3 4 times a day. Limit salt in your diet.  Avoid heavy lifting, wear low heal shoes, and practice good posture.  Rest a lot with your legs elevated if you have leg cramps or low back pain.  Visit your dentist if you have not gone during your pregnancy. Use a soft toothbrush to brush your teeth and be gentle when you floss.  A sexual relationship may be continued unless your caregiver directs you otherwise.  Do not travel far distances unless it is absolutely necessary and only with the approval of your caregiver.  Take prenatal classes to understand, practice, and ask questions about the labor and delivery.  Make a trial run to the hospital.  Pack your hospital bag.  Prepare the baby's nursery.  Continue to go to all your prenatal visits as directed  by your caregiver. SEEK MEDICAL CARE IF:  You are unsure if you are in labor or if your water has broken.  You have dizziness.  You have mild pelvic cramps, pelvic pressure, or nagging pain in your abdominal area.  You have persistent nausea, vomiting, or diarrhea.  You have a bad smelling vaginal discharge.  You have pain with urination. SEEK IMMEDIATE MEDICAL CARE IF:   You have a fever.  You are leaking fluid from your vagina.  You have spotting or bleeding from your vagina.  You have severe abdominal cramping or pain.  You have rapid weight loss or gain.  You have shortness of breath with chest pain.  You notice sudden or extreme swelling of your face, hands, ankles, feet, or legs.  You have not felt your baby move in over an hour.  You have severe headaches that do not go away with medicine.  You have vision changes. Document Released: 01/15/2001 Document Revised: 09/23/2012 Document Reviewed:  03/24/2012 ExitCare Patient Information 2014 [place]. Fetal Movement Counts Patient Name: __________________________________________________ Patient Due Date: ____________________ Performing a fetal movement count is highly recommended in high-risk pregnancies, but it is good for every pregnant woman to do. Your caregiver may ask you to start counting fetal movements at 28 weeks of the pregnancy. Fetal movements often increase:  After eating a full meal.  After physical activity.  After eating or drinking something sweet or cold.  At rest. Pay attention to when you feel the baby is most active. This will help you notice a pattern of your baby's sleep and wake cycles and what factors contribute to an increase in fetal movement. It is important to perform a fetal movement count at the same time each day when your baby is normally most active.  HOW TO COUNT FETAL MOVEMENTS 1. Find a quiet and comfortable area to sit or lie down on your left side. Lying on your left  side provides the best blood and oxygen circulation to your baby. 2. Write down the day and time on a sheet of paper or in a journal. 3. Start counting kicks, flutters, swishes, rolls, or jabs in a 2 hour period. You should feel at least 10 movements within 2 hours. 4. If you do not feel 10 movements in 2 hours, wait 2 3 hours and count again. Look for a change in the pattern or not enough counts in 2 hours. SEEK MEDICAL CARE IF:  You feel less than 10 counts in 2 hours, tried twice.  There is no movement in over an hour.  The pattern is changing or taking longer each day to reach 10 counts in 2 hours.  You feel the baby is not moving as he or she usually does. Date: ____________ Movements: ____________ Start time: ____________ Stephanie Acosta time: ____________  Date: ____________ Movements: ____________ Start time: ____________ Stephanie Acosta time: ____________ Date: ____________ Movements: ____________ Start time: ____________ Stephanie Acosta time: ____________ Date: ____________ Movements: ____________ Start time: ____________ Stephanie Acosta time: ____________ Date: ____________ Movements: ____________ Start time: ____________ Stephanie Acosta time: ____________ Date: ____________ Movements: ____________ Start time: ____________ Stephanie Acosta time: ____________ Date: ____________ Movements: ____________ Start time: ____________ Stephanie Acosta time: ____________ Date: ____________ Movements: ____________ Start time: ____________ Stephanie Acosta time: ____________  Date: ____________ Movements: ____________ Start time: ____________ Stephanie Acosta time: ____________ Date: ____________ Movements: ____________ Start time: ____________ Stephanie Acosta time: ____________ Date: ____________ Movements: ____________ Start time: ____________ Stephanie Acosta time: ____________ Date: ____________ Movements: ____________ Start time: ____________ Stephanie Acosta time: ____________ Date: ____________ Movements: ____________ Start time: ____________ Stephanie Acosta time: ____________ Date: ____________ Movements:  ____________ Start time: ____________ Stephanie Acosta time: ____________ Date: ____________ Movements: ____________ Start time: ____________ Stephanie Acosta time: ____________  Date: ____________ Movements: ____________ Start time: ____________ Stephanie Acosta time: ____________ Date: ____________ Movements: ____________ Start time: ____________ Stephanie Acosta time: ____________ Date: ____________ Movements: ____________ Start time: ____________ Stephanie Acosta time: ____________ Date: ____________ Movements: ____________ Start time: ____________ Stephanie Acosta time: ____________ Date: ____________ Movements: ____________ Start time: ____________ Stephanie Acosta time: ____________ Date: ____________ Movements: ____________ Start time: ____________ Stephanie Acosta time: ____________ Date: ____________ Movements: ____________ Start time: ____________ Stephanie Acosta time: ____________  Date: ____________ Movements: ____________ Start time: ____________ Stephanie Acosta time: ____________ Date: ____________ Movements: ____________ Start time: ____________ Stephanie Acosta time: ____________ Date: ____________ Movements: ____________ Start time: ____________ Stephanie Acosta time: ____________ Date: ____________ Movements: ____________ Start time: ____________ Stephanie Acosta time: ____________ Date: ____________ Movements: ____________ Start time: ____________ Stephanie Acosta time: ____________ Date: ____________ Movements: ____________ Start time: ____________ Stephanie Acosta time: ____________ Date: ____________ Movements: ____________ Start time: ____________ Stephanie Acosta time: ____________  Date:  ____________ Movements: ____________ Start time: ____________ Stephanie Acosta time: ____________ Date: ____________ Movements: ____________ Start time: ____________ Stephanie Acosta time: ____________ Date: ____________ Movements: ____________ Start time: ____________ Stephanie Acosta time: ____________ Date: ____________ Movements: ____________ Start time: ____________ Stephanie Acosta time: ____________ Date: ____________ Movements: ____________ Start time: ____________ Stephanie Acosta  time: ____________ Date: ____________ Movements: ____________ Start time: ____________ Stephanie Acosta time: ____________ Date: ____________ Movements: ____________ Start time: ____________ Stephanie Acosta time: ____________  Date: ____________ Movements: ____________ Start time: ____________ Stephanie Acosta time: ____________ Date: ____________ Movements: ____________ Start time: ____________ Stephanie Acosta time: ____________ Date: ____________ Movements: ____________ Start time: ____________ Stephanie Acosta time: ____________ Date: ____________ Movements: ____________ Start time: ____________ Stephanie Acosta time: ____________ Date: ____________ Movements: ____________ Start time: ____________ Stephanie Acosta time: ____________ Date: ____________ Movements: ____________ Start time: ____________ Stephanie Acosta time: ____________ Date: ____________ Movements: ____________ Start time: ____________ Stephanie Acosta time: ____________  Date: ____________ Movements: ____________ Start time: ____________ Stephanie Acosta time: ____________ Date: ____________ Movements: ____________ Start time: ____________ Stephanie Acosta time: ____________ Date: ____________ Movements: ____________ Start time: ____________ Stephanie Acosta time: ____________ Date: ____________ Movements: ____________ Start time: ____________ Stephanie Acosta time: ____________ Date: ____________ Movements: ____________ Start time: ____________ Stephanie Acosta time: ____________ Date: ____________ Movements: ____________ Start time: ____________ Stephanie Acosta time: ____________ Date: ____________ Movements: ____________ Start time: ____________ Stephanie Acosta time: ____________  Date: ____________ Movements: ____________ Start time: ____________ Stephanie Acosta time: ____________ Date: ____________ Movements: ____________ Start time: ____________ Stephanie Acosta time: ____________ Date: ____________ Movements: ____________ Start time: ____________ Stephanie Acosta time: ____________ Date: ____________ Movements: ____________ Start time: ____________ Stephanie Acosta time: ____________ Date: ____________  Movements: ____________ Start time: ____________ Stephanie Acosta time: ____________ Date: ____________ Movements: ____________ Start time: ____________ Stephanie Acosta time: ____________ Document Released: 02/20/2006 Document Revised: 01/08/2012 Document Reviewed: 11/18/2011 ExitCare Patient Information 2014 [employer]

## 2013-04-22 ENCOUNTER — Inpatient Hospital Stay (HOSPITAL_COMMUNITY)
Admission: AD | Admit: 2013-04-22 | Discharge: 2013-04-24 | DRG: 775 | Disposition: A | Discharge status: Home / Self Care | Payer: Federal, State, Local not specified - PPO | Source: Ambulatory Visit | Attending: Obstetrics and Gynecology | Admitting: Obstetrics and Gynecology

## 2013-04-22 ENCOUNTER — Encounter (HOSPITAL_COMMUNITY): Payer: Self-pay | Admitting: *Deleted

## 2013-04-22 DIAGNOSIS — Z3490 Encounter for supervision of normal pregnancy, unspecified, unspecified trimester: Secondary | ICD-10-CM

## 2013-04-22 LAB — CBC
HCT: 34.3 % — ABNORMAL LOW (ref 36.0–46.0)
Hemoglobin: 11 g/dL — ABNORMAL LOW (ref 12.0–15.0)
MCH: 25.6 pg — AB (ref 26.0–34.0)
MCHC: 32.1 g/dL (ref 30.0–36.0)
MCV: 80 fL (ref 78.0–100.0)
PLATELETS: 177 10*3/uL (ref 150–400)
RBC: 4.29 MIL/uL (ref 3.87–5.11)
RDW: 14.5 % (ref 11.5–15.5)
WBC: 10.7 10*3/uL — AB (ref 4.0–10.5)

## 2013-04-22 MED ORDER — ONDANSETRON HCL 4 MG/2ML IJ SOLN: 4.0000 mg | Freq: Four times a day (QID) | INTRAMUSCULAR | Status: DC | PRN

## 2013-04-22 MED ORDER — OXYCODONE-ACETAMINOPHEN 5-325 MG PO TABS: 1.0000 | ORAL_TABLET | ORAL | Status: DC | PRN

## 2013-04-22 MED ORDER — OXYTOCIN 40 UNITS IN LACTATED RINGERS INFUSION - SIMPLE MED
62.5000 mL/h | INTRAVENOUS | Status: DC
  Filled 2013-04-22: qty 1000

## 2013-04-22 MED ORDER — CITRIC ACID-SODIUM CITRATE 334-500 MG/5ML PO SOLN: 30.0000 mL | ORAL | Status: DC | PRN

## 2013-04-22 MED ORDER — LACTATED RINGERS IV SOLN: INTRAVENOUS | Status: DC

## 2013-04-22 MED ORDER — IBUPROFEN 600 MG PO TABS
600.0000 mg | ORAL_TABLET | Freq: Four times a day (QID) | ORAL | Status: DC | PRN
  Administered 2013-04-23: 600 mg via ORAL
  Filled 2013-04-22: qty 1

## 2013-04-22 MED ORDER — ACETAMINOPHEN 325 MG PO TABS: 650.0000 mg | ORAL_TABLET | ORAL | Status: DC | PRN

## 2013-04-22 MED ORDER — OXYTOCIN BOLUS FROM INFUSION
500.0000 mL | INTRAVENOUS | Status: DC
  Administered 2013-04-23: 500 mL via INTRAVENOUS

## 2013-04-22 MED ORDER — LACTATED RINGERS IV SOLN: 500.0000 mL | INTRAVENOUS | Status: DC | PRN

## 2013-04-22 MED ORDER — LIDOCAINE HCL (PF) 1 % IJ SOLN
30.0000 mL | INTRAMUSCULAR | Status: DC | PRN
  Filled 2013-04-22: qty 30

## 2013-04-22 NOTE — H&P (Signed)
NATHALYA WOLANSKI is a 23 y.o. female presenting for labor.  Pt changed cervical exam in [facility]U.  Pt having irregular ctx.  No vb or lof. H/o rapid labor with last delivery.    History OB History   Grav Para Term Preterm Abortions TAB SAB Ect Mult Living   3 1 1  0 1 0 1 0 0 1     Past Medical History  Diagnosis Date  . No pertinent past medical history   . Medical history non-contributory    Past Surgical History  Procedure Laterality Date  . No past surgeries    . Dilation and evacuation N/A 07/09/2012    Procedure: DILATATION AND EVACUATION;  Surgeon: Margarette Asal, MD;  Location: [facility];  Service: Gynecology;  Laterality: N/A;   Family History: family history includes Diabetes in her maternal grandfather and paternal grandmother. Social History:  reports that she has never smoked. She does not have any smokeless tobacco history on file. She reports that she does not drink alcohol or use illicit drugs.   Prenatal Transfer Tool  Maternal Diabetes: No Genetic Screening: Normal Maternal Ultrasounds/Referrals: Normal Fetal Ultrasounds or other Referrals:  None Maternal Substance Abuse:  No Significant Maternal Medications:  None Significant Maternal Lab Results:  None Other Comments:  None  ROS  Dilation: 5 Effacement (%): 70 Station: -2 Exam by:: [employer] RN Blood pressure 118/75, pulse 92, temperature 98.3 F (36.8 C), temperature source Oral, resp. rate 18, height 5' 4"  (1.626 m), weight 86.274 kg (190 lb 3.2 oz), last menstrual period 08/02/2012. Exam Physical Exam  Gen - NAD Abd - gravid, NT Ext - NT Cvx 4cm - changed to 5cm  Prenatal labs: ABO, Rh: O/Positive/-- (08/26 0000) Antibody: Negative (08/26 0000) Rubella: Immune (08/26 0000) RPR: Nonreactive (08/26 0000)  HBsAg: Negative (08/26 0000)  HIV: Non-reactive (08/26 0000)  GBS: Negative (03/15 0000)   Assessment/Plan: Admit Exp mngt Epidural prn  Florenda Watt 04/22/2013, 10:18 PM

## 2013-04-22 NOTE — MAU Note (Signed)
Pt reports she was  Here last night and she was 3cm. at Dr. Gabriel Carina and she was check and she was 4-5cm (Dr. Helane Rima). Pt reports not having regular ctx.

## 2013-04-23 ENCOUNTER — Inpatient Hospital Stay (HOSPITAL_COMMUNITY): Payer: Federal, State, Local not specified - PPO | Admitting: Anesthesiology

## 2013-04-23 ENCOUNTER — Encounter (HOSPITAL_COMMUNITY): Payer: Federal, State, Local not specified - PPO | Admitting: Anesthesiology

## 2013-04-23 ENCOUNTER — Encounter (HOSPITAL_COMMUNITY): Payer: Self-pay | Admitting: Anesthesiology

## 2013-04-23 LAB — RPR: RPR: NONREACTIVE

## 2013-04-23 MED ORDER — SENNOSIDES-DOCUSATE SODIUM 8.6-50 MG PO TABS
2.0000 | ORAL_TABLET | ORAL | Status: DC
  Administered 2013-04-23: 2 via ORAL
  Filled 2013-04-23: qty 2

## 2013-04-23 MED ORDER — MEDROXYPROGESTERONE ACETATE 150 MG/ML IM SUSP: 150.0000 mg | INTRAMUSCULAR | Status: DC | PRN

## 2013-04-23 MED ORDER — DIPHENHYDRAMINE HCL 25 MG PO CAPS: 25.0000 mg | ORAL_CAPSULE | Freq: Four times a day (QID) | ORAL | Status: DC | PRN

## 2013-04-23 MED ORDER — ONDANSETRON HCL 4 MG/2ML IJ SOLN: 4.0000 mg | INTRAMUSCULAR | Status: DC | PRN

## 2013-04-23 MED ORDER — IBUPROFEN 600 MG PO TABS
600.0000 mg | ORAL_TABLET | Freq: Four times a day (QID) | ORAL | Status: DC
  Administered 2013-04-23 – 2013-04-24 (×4): 600 mg via ORAL
  Filled 2013-04-23 (×5): qty 1

## 2013-04-23 MED ORDER — ONDANSETRON HCL 4 MG PO TABS: 4.0000 mg | ORAL_TABLET | ORAL | Status: DC | PRN

## 2013-04-23 MED ORDER — ZOLPIDEM TARTRATE 5 MG PO TABS: 5.0000 mg | ORAL_TABLET | Freq: Every evening | ORAL | Status: DC | PRN

## 2013-04-23 MED ORDER — DIPHENHYDRAMINE HCL 50 MG/ML IJ SOLN: 12.5000 mg | INTRAMUSCULAR | Status: DC | PRN

## 2013-04-23 MED ORDER — MEASLES, MUMPS & RUBELLA VAC ~~LOC~~ INJ
0.5000 mL | INJECTION | Freq: Once | SUBCUTANEOUS | Status: DC
  Filled 2013-04-23: qty 0.5

## 2013-04-23 MED ORDER — LANOLIN HYDROUS EX OINT: TOPICAL_OINTMENT | CUTANEOUS | Status: DC | PRN

## 2013-04-23 MED ORDER — FENTANYL 2.5 MCG/ML BUPIVACAINE 1/10 % EPIDURAL INFUSION (WH - ANES)
INTRAMUSCULAR | Status: DC | PRN
  Administered 2013-04-23: 14 mL/h via EPIDURAL

## 2013-04-23 MED ORDER — BENZOCAINE-MENTHOL 20-0.5 % EX AERO
1.0000 "application " | INHALATION_SPRAY | CUTANEOUS | Status: DC | PRN
  Administered 2013-04-23: 1 via TOPICAL
  Filled 2013-04-23: qty 56

## 2013-04-23 MED ORDER — LACTATED RINGERS IV SOLN
500.0000 mL | Freq: Once | INTRAVENOUS | Status: AC
  Administered 2013-04-23: 500 mL via INTRAVENOUS

## 2013-04-23 MED ORDER — TETANUS-DIPHTH-ACELL PERTUSSIS 5-2.5-18.5 LF-MCG/0.5 IM SUSP: 0.5000 mL | Freq: Once | INTRAMUSCULAR | Status: DC

## 2013-04-23 MED ORDER — LIDOCAINE HCL (PF) 1 % IJ SOLN
INTRAMUSCULAR | Status: DC | PRN
  Administered 2013-04-23: 9 mL
  Administered 2013-04-23: 8 mL

## 2013-04-23 MED ORDER — FLEET ENEMA 7-19 GM/118ML RE ENEM: 1.0000 | ENEMA | Freq: Every day | RECTAL | Status: DC | PRN

## 2013-04-23 MED ORDER — OXYTOCIN 40 UNITS IN LACTATED RINGERS INFUSION - SIMPLE MED
1.0000 m[IU]/min | INTRAVENOUS | Status: DC
  Administered 2013-04-23: 2 m[IU]/min via INTRAVENOUS

## 2013-04-23 MED ORDER — EPHEDRINE 5 MG/ML INJ
10.0000 mg | INTRAVENOUS | Status: DC | PRN
  Filled 2013-04-23: qty 2

## 2013-04-23 MED ORDER — BUTORPHANOL TARTRATE 1 MG/ML IJ SOLN: 1.0000 mg | INTRAMUSCULAR | Status: DC | PRN

## 2013-04-23 MED ORDER — PHENYLEPHRINE 40 MCG/ML (10ML) SYRINGE FOR IV PUSH (FOR BLOOD PRESSURE SUPPORT)
80.0000 ug | PREFILLED_SYRINGE | INTRAVENOUS | Status: DC | PRN
  Filled 2013-04-23: qty 2

## 2013-04-23 MED ORDER — WITCH HAZEL-GLYCERIN EX PADS: 1.0000 "application " | MEDICATED_PAD | CUTANEOUS | Status: DC | PRN

## 2013-04-23 MED ORDER — FENTANYL 2.5 MCG/ML BUPIVACAINE 1/10 % EPIDURAL INFUSION (WH - ANES)
14.0000 mL/h | INTRAMUSCULAR | Status: DC | PRN
  Filled 2013-04-23: qty 125

## 2013-04-23 MED ORDER — BISACODYL 10 MG RE SUPP: 10.0000 mg | Freq: Every day | RECTAL | Status: DC | PRN

## 2013-04-23 MED ORDER — PHENYLEPHRINE 40 MCG/ML (10ML) SYRINGE FOR IV PUSH (FOR BLOOD PRESSURE SUPPORT)
80.0000 ug | PREFILLED_SYRINGE | INTRAVENOUS | Status: DC | PRN
  Filled 2013-04-23: qty 2
  Filled 2013-04-23: qty 10

## 2013-04-23 MED ORDER — TERBUTALINE SULFATE 1 MG/ML IJ SOLN: 0.2500 mg | Freq: Once | INTRAMUSCULAR | Status: DC | PRN

## 2013-04-23 MED ORDER — PRENATAL MULTIVITAMIN CH
1.0000 | ORAL_TABLET | Freq: Every day | ORAL | Status: DC
  Administered 2013-04-23 – 2013-04-24 (×2): 1 via ORAL
  Filled 2013-04-23 (×2): qty 1

## 2013-04-23 MED ORDER — OXYCODONE-ACETAMINOPHEN 5-325 MG PO TABS: 1.0000 | ORAL_TABLET | ORAL | Status: DC | PRN

## 2013-04-23 MED ORDER — DIBUCAINE 1 % RE OINT: 1.0000 "application " | TOPICAL_OINTMENT | RECTAL | Status: DC | PRN

## 2013-04-23 MED ORDER — SIMETHICONE 80 MG PO CHEW: 80.0000 mg | CHEWABLE_TABLET | ORAL | Status: DC | PRN

## 2013-04-23 MED ORDER — EPHEDRINE 5 MG/ML INJ
10.0000 mg | INTRAVENOUS | Status: DC | PRN
  Filled 2013-04-23: qty 2
  Filled 2013-04-23: qty 4

## 2013-04-23 NOTE — Lactation Note (Signed)
This note was copied from the chart of [place]. Lactation Consultation Note Initial consult:  Mother's plan is to pump and give baby breast and formula in bottle. Mother has her own Ameda DEBP.  Encouraged mother to start pumping within the first 6 hours of birth along with STS. Reviewed breast massage, milk storage, hand expression, pumping schedule, volume guidelines, LC/OP services and brochure. Provided small colostrum bottles and bin and soap to wash parts. Encouraged mother to call if she needs further assistance.   Patient Name: Stephanie Acosta [id number] Date: 04/23/2013     Maternal Data Formula Feeding for Exclusion: Yes Reason for exclusion: Mother's choice to formula and breast feed on admission  Feeding    LATCH Score/Interventions                      Lactation Tools Discussed/Used     Consult Status Consult Status: Follow-up Date: 04/24/13 Follow-up type: In-patient    Vivianne Master [facility] 04/23/2013, 11:49 AM

## 2013-04-23 NOTE — Progress Notes (Signed)
Patient is feeling infrequent contractions. FHR is Category 1 Toco irregular contractions Cervix is 80% / 5 to 6 cm - 2  Vertex arom Minimal fluid  IMPRESSION: IUP at 37 w 6 days 2 vessel cord  PLAN: Pitocin augmentation Anticipate SVD

## 2013-04-23 NOTE — Anesthesia Preprocedure Evaluation (Signed)
Anesthesia Evaluation  Patient identified by MRN, date of birth, ID band Patient awake    Reviewed: Allergy & Precautions, H&P , NPO status , Patient's Chart, lab work & pertinent test results  Airway Mallampati: II TM Distance: >3 FB Neck ROM: full    Dental no notable dental hx.    Pulmonary neg pulmonary ROS,    Pulmonary exam normal       Cardiovascular negative cardio ROS      Neuro/Psych negative neurological ROS  negative psych ROS   GI/Hepatic negative GI ROS, Neg liver ROS,   Endo/Other  negative endocrine ROS  Renal/GU negative Renal ROS     Musculoskeletal   Abdominal Normal abdominal exam  (+)   Peds  Hematology negative hematology ROS (+)   Anesthesia Other Findings   Reproductive/Obstetrics (+) Pregnancy                           Anesthesia Physical Anesthesia Plan  ASA: II  Anesthesia Plan: Epidural   Post-op Pain Management:    Induction:   Airway Management Planned:   Additional Equipment:   Intra-op Plan:   Post-operative Plan:   Informed Consent: I have reviewed the patients History and Physical, chart, labs and discussed the procedure including the risks, benefits and alternatives for the proposed anesthesia with the patient or authorized representative who has indicated his/her understanding and acceptance.     Plan Discussed with:   Anesthesia Plan Comments:         Anesthesia Quick Evaluation

## 2013-04-23 NOTE — Anesthesia Postprocedure Evaluation (Signed)
  Anesthesia Post-op Note  Anesthesia Post Note  Patient: Stephanie Acosta  Procedure(s) Performed: * No procedures listed *  Anesthesia type: Epidural  Patient location: Mother/Baby  Post pain: Pain level controlled  Post assessment: Post-op Vital signs reviewed  Last Vitals:  Filed Vitals:   04/23/13 1235  BP: 105/73  Pulse: 77  Temp: 36.4 C  Resp: 20    Post vital signs: Reviewed  Level of consciousness:alert  Complications: No apparent anesthesia complications

## 2013-04-23 NOTE — Lactation Note (Signed)
This note was copied from the chart of [place]. Lactation Consultation Note Follow up consult:  Mother requested help with pumping. Set up DEBP, reviewed cleaning, milk storage and pumping schedule. Provided mother with small bottles and spoons for feeding. Mother will be supplementing with formula. Reviewed supply and demand, answered questions about mastitis, thrush and plugged ducts. Recommend mother pump every 3 hours for 15-20 minutes, first massage, then hand express and then pump. Encouraged mother to place baby STS often and call if she needs further assistance. Patient Name: Stephanie Acosta [id number] Date: 04/23/2013 Reason for consult: Follow-up assessment   Maternal Data Has patient been taught Hand Expression?: Yes Does the patient have breastfeeding experience prior to this delivery?: No  Feeding    LATCH Score/Interventions                      Lactation Tools Discussed/Used Pump Review: Setup, frequency, and cleaning;Milk Storage Initiated by:: Vivianne Master RN Date initiated:: 04/23/13   Consult Status Consult Status: Follow-up Date: 04/24/13 Follow-up type: In-patient    Vivianne Master [facility] 04/23/2013, 3:52 PM

## 2013-04-23 NOTE — Anesthesia Procedure Notes (Addendum)
Epidural Patient location during procedure: OB Start time: 04/23/2013 8:16 AM End time: 04/23/2013 8:20 AM  Staffing Anesthesiologist: Lyn Hollingshead Performed by: anesthesiologist   Preanesthetic Checklist Completed: patient identified, surgical consent, pre-op evaluation, timeout performed, IV checked, risks and benefits discussed and monitors and equipment checked  Epidural Patient position: sitting Prep: site prepped and draped and DuraPrep Patient monitoring: continuous pulse ox and blood pressure Approach: midline Location: L3-L4 Injection technique: LOR air  Needle:  Needle type: Tuohy  Needle gauge: 17 G Needle length: 9 cm and 9 Needle insertion depth: 7 cm Catheter type: closed end flexible Catheter size: 19 Gauge Catheter at skin depth: 12 cm Test dose: negative and Other  Assessment Sensory level: T9 Events: blood not aspirated, injection not painful, no injection resistance, negative IV test and no paresthesia  Additional Notes Reason for block:procedure for pain

## 2013-04-24 ENCOUNTER — Ambulatory Visit: Payer: Self-pay

## 2013-04-24 LAB — CBC
HCT: 31.9 % — ABNORMAL LOW (ref 36.0–46.0)
HEMOGLOBIN: 9.9 g/dL — AB (ref 12.0–15.0)
MCH: 25.4 pg — AB (ref 26.0–34.0)
MCHC: 31 g/dL (ref 30.0–36.0)
MCV: 81.8 fL (ref 78.0–100.0)
PLATELETS: 168 10*3/uL (ref 150–400)
RBC: 3.9 MIL/uL (ref 3.87–5.11)
RDW: 14.6 % (ref 11.5–15.5)
WBC: 10.4 10*3/uL (ref 4.0–10.5)

## 2013-04-24 NOTE — Progress Notes (Signed)
Post Partum Day 1 Subjective: no complaints  Objective: Blood pressure 122/75, pulse 76, temperature 97.6 F (36.4 C), temperature source Oral, resp. rate 18, height 5' 4"  (1.626 m), weight 86.274 kg (190 lb 3.2 oz), last menstrual period 08/02/2012, SpO2 99.00%, unknown if currently breastfeeding.  Physical Exam:  General: alert, cooperative and appears stated age 23: appropriate Uterine Fundus: firm Incision: healing well, no significant drainage, no dehiscence, no significant erythema DVT Evaluation: No evidence of DVT seen on physical exam.   Recent Labs  04/22/13 2230 04/24/13 0605  HGB 11.0* 9.9*  HCT 34.3* 31.9*    Assessment/Plan: Discharge home Circ today   LOS: 2 days   Gabryel Files L 04/24/2013, 7:50 AM

## 2013-04-24 NOTE — Discharge Summary (Signed)
Obstetric Discharge Summary Reason for Admission: onset of labor Prenatal Procedures: none Intrapartum Procedures: spontaneous vaginal delivery Postpartum Procedures: none Complications-Operative and Postpartum: none Hemoglobin  Date Value Ref Range Status  04/24/2013 9.9* 12.0 - 15.0 g/dL Final     HCT  Date Value Ref Range Status  04/24/2013 31.9* 36.0 - 46.0 % Final    Physical Exam:  General: alert, cooperative and appears stated age 23: appropriate Uterine Fundus: firm Incision: healing well, no significant drainage DVT Evaluation: No evidence of DVT seen on physical exam.  Discharge Diagnoses: Term Pregnancy-delivered  Discharge Information: Date: 04/24/2013 Activity: pelvic rest Diet: routine Medications: None Condition: improved Instructions: refer to practice specific booklet Discharge to: home   Newborn Data: Live born female  Birth Weight: 6 lb 13.7 oz (3110 g) APGAR: 9, 9  Home with mother.  Stephanie Acosta L 04/24/2013, 7:51 AM

## 2013-04-24 NOTE — Lactation Note (Signed)
This note was copied from the chart of [place]. Lactation Consultation Note    Initial consult with this mom of a 63 5/[redacted] week gestation baby, weighing 6 lbs 11 oz. Mom wants to pump and bottle feed, and has an [employer] DEP at home. She has not expressed any colostrum so far, and has been formula feeding. I showed mom how to hand express, and she was able to express a small  Drop from each breast. Engogrement care reviewed with mo, and mom advised to pump every 3 hours, 8 times a day. Mom knows to call lactation for questions/concerns.   Patient Name: Stephanie Acosta UDAPT'C Date: 04/24/2013 Reason for consult: Initial assessment   Maternal Data Formula Feeding for Exclusion: Yes Reason for exclusion: Mother's choice to formula and breast feed on admission  Feeding    LATCH Score/Interventions                      Lactation Tools Discussed/Used Tools: Pump Initiated by:: bedside rn Date initiated:: 04/23/13   Consult Status Consult Status: Complete Follow-up type: Call as needed    Tonna Corner 04/24/2013, 3:53 PM

## 2013-10-05 DIAGNOSIS — K900 Celiac disease: Secondary | ICD-10-CM

## 2013-10-05 HISTORY — DX: Celiac disease: K90.0

## 2013-12-06 ENCOUNTER — Encounter (HOSPITAL_COMMUNITY): Payer: Self-pay | Admitting: Anesthesiology

## 2014-02-11 ENCOUNTER — Inpatient Hospital Stay (HOSPITAL_COMMUNITY)
Admission: AD | Admit: 2014-02-11 | Discharge: 2014-02-11 | Disposition: A | Discharge status: Home / Self Care | Payer: Federal, State, Local not specified - PPO | Source: Ambulatory Visit | Attending: Obstetrics and Gynecology | Admitting: Obstetrics and Gynecology

## 2014-02-11 ENCOUNTER — Encounter (HOSPITAL_COMMUNITY): Payer: Self-pay | Admitting: *Deleted

## 2014-02-11 DIAGNOSIS — N390 Urinary tract infection, site not specified: Secondary | ICD-10-CM | POA: Diagnosis not present

## 2014-02-11 DIAGNOSIS — R109 Unspecified abdominal pain: Secondary | ICD-10-CM | POA: Diagnosis present

## 2014-02-11 HISTORY — DX: Polycystic ovarian syndrome: E28.2

## 2014-02-11 HISTORY — DX: Celiac disease: K90.0

## 2014-02-11 LAB — URINALYSIS, ROUTINE W REFLEX MICROSCOPIC
Bilirubin Urine: NEGATIVE
Glucose, UA: NEGATIVE mg/dL
Hgb urine dipstick: NEGATIVE
Ketones, ur: NEGATIVE mg/dL
Nitrite: NEGATIVE
Protein, ur: NEGATIVE mg/dL
Specific Gravity, Urine: 1.01 (ref 1.005–1.030)
Urobilinogen, UA: 0.2 mg/dL (ref 0.0–1.0)
pH: 6 (ref 5.0–8.0)

## 2014-02-11 LAB — URINE MICROSCOPIC-ADD ON

## 2014-02-11 LAB — POCT PREGNANCY, URINE: Preg Test, Ur: NEGATIVE

## 2014-02-11 MED ORDER — PHENAZOPYRIDINE HCL 100 MG PO TABS: 100.0000 mg | ORAL_TABLET | Freq: Three times a day (TID) | ORAL | Status: DC | PRN

## 2014-02-11 MED ORDER — PHENAZOPYRIDINE HCL 100 MG PO TABS
200.0000 mg | ORAL_TABLET | Freq: Once | ORAL | Status: AC
  Administered 2014-02-11: 200 mg via ORAL
  Filled 2014-02-11: qty 2

## 2014-02-11 MED ORDER — PHENAZOPYRIDINE HCL 200 MG PO TABS: 200.0000 mg | ORAL_TABLET | Freq: Once | ORAL | Status: DC

## 2014-02-11 MED ORDER — CIPROFLOXACIN HCL 500 MG PO TABS: 500.0000 mg | ORAL_TABLET | Freq: Two times a day (BID) | ORAL | Status: DC

## 2014-02-11 NOTE — MAU Provider Note (Signed)
History     CSN: [id number]  Arrival date and time: 02/11/14 2145   First Provider Initiated Contact with Patient 02/11/14 2256      Chief Complaint  Patient presents with  . Urinary Tract Infection   HPI   [name] is a 24 y.o. female [phone] who presents with abdominal pain and lower back pain. She was seen by her PCP on Monday and diagnosed with a bladder infection. She has been taking her medication as prescribed. She feels her symptoms have worsened throughout the week.  She had an [place] on Monday that confirmed that her Mirena IUD was in the correct place.   Denies fevers Denies vaginal discharge She has had some scant amount of blood noted on the toilet paper this week.  No new sexual partners; not concerned about STI's    She has had several UTI's in her life.  OB History    Gravida Para Term Preterm AB TAB SAB Ectopic Multiple Living   3 2 2  0 1 0 1 0 0 2      Past Medical History  Diagnosis Date  . Celiac disease sept 2015  . PCOS (polycystic ovarian syndrome) 2013    Past Surgical History  Procedure Laterality Date  . Dilation and evacuation N/A 07/09/2012    Procedure: DILATATION AND EVACUATION;  Surgeon: Margarette Asal, MD;  Location: [facility];  Service: Gynecology;  Laterality: N/A;  . Wisdom tooth extraction      Family History  Problem Relation Age of Onset  . Diabetes Maternal Grandfather   . Diabetes Paternal Grandmother     History  Substance Use Topics  . Smoking status: Never Smoker   . Smokeless tobacco: Not on file  . Alcohol Use: Yes    Allergies:  Allergies  Allergen Reactions  . Sulfa Antibiotics Rash    Childhood reaction    Prescriptions prior to admission  Medication Sig Dispense Refill Last Dose  . nitrofurantoin, macrocrystal-monohydrate, (MACROBID) 100 MG capsule Take 100 mg by mouth 2 (two) times daily.   02/11/2014 at Unknown time  . phentermine 15 MG capsule Take 15 mg by mouth every morning.     Stephanie Acosta UNABLE TO  FIND Pt unsure of name--medication for anti-stomach spasms for celiac disease.   Past Month at Unknown time  . diphenhydramine-acetaminophen (TYLENOL PM) 25-500 MG TABS Take 2 tablets by mouth at bedtime as needed (for sleep).   2 weeks  . omeprazole (PRILOSEC) 20 MG capsule Take 20 mg by mouth at bedtime.   04/21/2013 at Unknown time  . Prenatal Vit-Fe Fumarate-FA (PRENATAL MULTIVITAMIN) TABS tablet Take 1 tablet by mouth at bedtime.    04/21/2013 at Unknown time   Results for orders placed or performed during the hospital encounter of 02/11/14 (from the past 48 hour(s))  Urinalysis, Routine w reflex microscopic     Status: Abnormal   Collection Time: 02/11/14  9:47 PM  Result Value Ref Range   Color, Urine YELLOW YELLOW   APPearance CLEAR CLEAR   Specific Gravity, Urine 1.010 1.005 - 1.030   pH 6.0 5.0 - 8.0   Glucose, UA NEGATIVE NEGATIVE mg/dL   Hgb urine dipstick NEGATIVE NEGATIVE   Bilirubin Urine NEGATIVE NEGATIVE   Ketones, ur NEGATIVE NEGATIVE mg/dL   Protein, ur NEGATIVE NEGATIVE mg/dL   Urobilinogen, UA 0.2 0.0 - 1.0 mg/dL   Nitrite NEGATIVE NEGATIVE   Leukocytes, UA TRACE (A) NEGATIVE  Urine microscopic-add on     Status:  None   Collection Time: 02/11/14  9:47 PM  Result Value Ref Range   Squamous Epithelial / LPF RARE RARE   WBC, UA 0-2 <3 WBC/hpf   RBC / HPF 0-2 <3 RBC/hpf   Bacteria, UA RARE RARE  Pregnancy, urine POC     Status: None   Collection Time: 02/11/14 10:03 PM  Result Value Ref Range   Preg Test, Ur NEGATIVE NEGATIVE    Comment:        THE SENSITIVITY OF THIS METHODOLOGY IS >24 mIU/mL     Review of Systems  Constitutional: Negative for fever and chills.  Gastrointestinal: Positive for abdominal pain (Constant; mid lower abdominal pain ).  Genitourinary: Positive for urgency (Pressure during urination ). Negative for dysuria and frequency.       Denies vaginal bleeding   Musculoskeletal: Positive for back pain (Bilateral lower back pain ).    Physical Exam   Blood pressure 136/74, pulse 104, temperature 98.4 F (36.9 C), resp. rate 18, height 5' 4"  (1.626 m), weight 88.089 kg (194 lb 3.2 oz), not currently breastfeeding.  Physical Exam  Constitutional: She is oriented to person, place, and time. She appears well-developed and well-nourished. No distress.  HENT:  Head: Normocephalic.  Cardiovascular: Normal rate and normal heart sounds.   Respiratory: Effort normal.  GI: Normal appearance. There is tenderness in the suprapubic area. There is no rigidity, no guarding and no CVA tenderness.  Musculoskeletal: Normal range of motion.  Neurological: She is alert and oriented to person, place, and time.  Skin: Skin is warm. She is not diaphoretic.  Psychiatric: Her behavior is normal.    MAU Course  Procedures  None  MDM UA Pyridium  Urine culture pending Consulted with Dr. Radene Knee  Assessment and Plan   A:  1. UTI (lower urinary tract infection)    P:  Discharge home in stable condition RX Cipro Stop macrobid Follow up with PCP as scheduled Return to MAU if symptoms worsen   Darrelyn Hillock Rasch, NP 02/12/2014 3:43 AM

## 2014-02-11 NOTE — Discharge Instructions (Signed)
Antibiotic Medication Antibiotics are among the most frequently prescribed medicines. Antibiotics cure illness by assisting our body to injure or kill the bacteria that cause infection. While antibiotics are useful to treat a wide variety of infections they are useless against viruses. Antibiotics cannot cure colds, flu, or other viral infections.  There are many types of antibiotics available. Your caregiver will decide which antibiotic will be useful for an illness. Never take or give someone else's antibiotics or left over medicine. Your caregiver may also take into account:  Allergies.  The cost of the medicine.  Dosing schedules.  Taste.  Common side effects when choosing an antibiotic for an infection. Ask your caregiver if you have questions about why a certain medicine was chosen. HOME CARE INSTRUCTIONS Read all instructions and labels on medicine bottles carefully. Some antibiotics should be taken on an empty stomach while others should be taken with food. Taking antibiotics incorrectly may reduce how well they work. Some antibiotics need to be kept in the refrigerator. Others should be kept at room temperature. Ask your caregiver or pharmacist if you do not understand how to give the medicine. Be sure to give the amount of medicine your caregiver has prescribed. Even if you feel better and your symptoms improve, bacteria may still remain alive in the body. Taking all of the medicine will prevent:  The infection from returning and becoming harder to treat.  Complications from partially treated infections. If there is any medicine left over after you have taken the medicine as your caregiver has instructed, throw the medicine away. Be sure to tell your caregiver if you:  Are allergic to any medicines.  Are pregnant or intend to become pregnant while using this medicine.  Are breastfeeding.  Are taking any other prescription, non-prescription medicine, or herbal  remedies.  Have any other medical conditions or problems you have not already discussed. If you are taking birth control pills, they may not work while you are on antibiotics. To avoid unwanted pregnancy:  Continue taking your birth control pills as usual.  Use a second form of birth control (such as condoms) while you are taking antibiotic medicine.  When you finish taking the antibiotic medicine, continue using the second form of birth control until you are finished with your current 1 month cycle of birth control pills. Try not to miss any doses of medicine. If you miss a dose, take it as soon as possible. However, if it is almost time for the next dose and the dosing schedule is:  2 doses a day, take the missed dose and the next dose 5 to 6 hours apart.  3 or more doses a day, take the missed dose and the next dose 2 to 4 hours apart, then go back to the normal schedule.  If you are unable to make up a missed dose, take the next scheduled dose on time and complete the missed dose at the end of the prescribed time for your medicine. SIDE EFFECTS TO TAKING ANTIBIOTICS Common side effects to antibiotic use include:  Soft stools or diarrhea.  Mild stomach upset.  Sun sensitivity. SEEK MEDICAL CARE IF:   If you get worse or do not improve within a few days of starting the medicine.  Vomiting develops.  Diaper rash or rash on the genitals appears.  Vaginal itching occurs.  White patches appear on the tongue or in the mouth.  Severe watery diarrhea and abdominal cramps occur.  Signs of an allergy develop (hives, unknown  itchy rash appears). STOP TAKING THE ANTIBIOTIC. SEEK IMMEDIATE MEDICAL CARE IF:   Urine turns dark or blood colored.  Skin turns yellow.  Easy bruising or bleeding occurs.  Joint pain or muscle aches occur.  Fever returns.  Severe headache occurs.  Signs of an allergy develop (trouble breathing, wheezing, swelling of the lips, face or tongue,  fainting, or blisters on the skin or in the mouth). STOP TAKING THE ANTIBIOTIC. Document Released: 10/04/2003 Document Revised: 04/15/2011 Document Reviewed: 10/13/2008 [facility] Patient Information 2015 [place], [place]. This information is not intended to replace advice given to you by your health care provider. Make sure you discuss any questions you have with your health care provider.

## 2014-02-11 NOTE — MAU Note (Signed)
Started treatment for uti this past Monday. Juanda Chance NP at [facility] is treating me. Since yesterday i feel worse. Flank area hurting, nauseated, not sleeping. Called office and have appt for TUes.

## 2014-02-13 LAB — URINE CULTURE: Colony Count: 30000

## 2014-02-15 ENCOUNTER — Other Ambulatory Visit (HOSPITAL_COMMUNITY): Payer: Self-pay | Admitting: Obstetrics and Gynecology

## 2014-02-15 DIAGNOSIS — R102 Pelvic and perineal pain: Secondary | ICD-10-CM

## 2014-02-17 ENCOUNTER — Ambulatory Visit (HOSPITAL_COMMUNITY)
Admission: RE | Admit: 2014-02-17 | Discharge: 2014-02-17 | Disposition: A | Discharge status: Home / Self Care | Payer: Federal, State, Local not specified - PPO | Source: Ambulatory Visit | Attending: Obstetrics and Gynecology | Admitting: Obstetrics and Gynecology

## 2014-02-17 DIAGNOSIS — M25552 Pain in left hip: Secondary | ICD-10-CM | POA: Diagnosis not present

## 2014-02-17 DIAGNOSIS — R102 Pelvic and perineal pain: Secondary | ICD-10-CM | POA: Insufficient documentation

## 2014-02-17 DIAGNOSIS — M25551 Pain in right hip: Secondary | ICD-10-CM | POA: Insufficient documentation

## 2014-02-17 MED ORDER — IOHEXOL 300 MG/ML  SOLN
100.0000 mL | Freq: Once | INTRAMUSCULAR | Status: AC | PRN
  Administered 2014-02-17: 100 mL via INTRAVENOUS

## 2014-08-18 ENCOUNTER — Other Ambulatory Visit: Payer: Self-pay | Admitting: Obstetrics and Gynecology

## 2014-08-19 LAB — CYTOLOGY - PAP

## 2014-08-29 NOTE — H&P (Signed)
[facility]  DICTATION # [id number] CSN# [id number]   Margarette Asal, MD 08/29/2014 1:28 PM

## 2014-08-30 NOTE — H&P (Signed)
Stephanie Acosta, Stephanie Acosta             ACCOUNT NO.:  [id number]  MEDICAL RECORD NO.:  [id number]  LOCATION:                                FACILITY:  [facility]  PHYSICIAN:  Ralene Bathe. Matthew Saras, M.D.DATE OF BIRTH:  1990-06-09  DATE OF ADMISSION:  09/07/2014 DATE OF DISCHARGE:                             HISTORY & PHYSICAL   CHIEF COMPLAINT:  Acute and chronic pelvic pain, dyspareunia.  HISTORY OF PRESENT ILLNESS:  A 24 year old, G3, P2, has a Mirena IUD. She relates a 6 to 12 month history of pelvic pain and deep pain with intercourse, making sex very uncomfortable.  As part of her evaluation, she has had in January 2016, an ultrasound in our office that showed no abnormalities.  Also in January 2016, had an abdominal pelvic CT that showed no evidence of appendicitis, retroflexed uterus with an IUD, adnexa negative with some small nonspecific mesenteric nodes, possible mesenteric adenitis, no other abnormalities.  She has continued to have problems with dysmenorrhea, pelvic pain, and also deep dyspareunia to the point that she wants further evaluation.  Procedure of diagnostic laparoscopy discussed, specifically discussed risks related to bleeding, infection, other complications that may require open or additional surgery discussed.  Possible findings such as adhesions or endometriosis and how those problems are treated reviewed with her which she understands and accepts.  ALLERGIES:  SULFA.  CURRENT MEDICATIONS:  Hyoscyamine for IBS symptoms.  PAST MEDICAL HISTORY:  She has had 2 prior vaginal deliveries.  REVIEW OF SYSTEMS:  Significant for history of headache, IBS.  FAMILY HISTORY:  Significant for hernia, diabetes, breast and colon cancer.  SOCIAL HISTORY:  She is married.  Denies alcohol, tobacco, or drug use.  PHYSICAL EXAMINATION:  VITAL SIGNS:  Temp 98.2, blood pressure 120/78. HEENT:  Unremarkable. NECK:  Supple without masses. LUNGS:  Clear. CARDIOVASCULAR:  Regular rate  and rhythm without murmurs, rubs, or gallops. BREASTS:  Without masses.  ABDOMEN:  Soft, flat, nontender. GU:  Vulva, vagina, cervix normal.  Uterus mid position, nontender.  No unusual nodularity.  Adnexa without masses or significant tenderness. EXTREMITIES:  Unremarkable. NEUROLOGIC:  Unremarkable.  IMPRESSION:  Acute on chronic pelvic pain, rule out pelvic adhesions, endometriosis.  PLAN:  Diagnostic laparoscopy.  Procedure and risks reviewed as above.     Jillian Warth M. Matthew Saras, M.D.     RM[facility]/MEDQ  D:  08/29/2014  T:  08/30/2014  Job:  [id number]

## 2014-09-02 ENCOUNTER — Encounter (HOSPITAL_BASED_OUTPATIENT_CLINIC_OR_DEPARTMENT_OTHER): Payer: Self-pay | Admitting: *Deleted

## 2014-09-05 ENCOUNTER — Encounter (HOSPITAL_BASED_OUTPATIENT_CLINIC_OR_DEPARTMENT_OTHER): Payer: Self-pay | Admitting: *Deleted

## 2014-09-05 NOTE — Progress Notes (Signed)
NPO AFTER MN. ARRIVE AT 0700.  NEEDS HG AND URINE PREG. WILL TAKE PRILOSEC W/ SIPS OF WATER.

## 2014-09-06 NOTE — Anesthesia Preprocedure Evaluation (Addendum)
Anesthesia Evaluation  Patient identified by MRN, date of birth, ID band Patient awake    Reviewed: Allergy & Precautions, NPO status , Patient's Chart, lab work & pertinent test results  Airway Mallampati: II  TM Distance: >3 FB Neck ROM: Full    Dental no notable dental hx.    Pulmonary neg pulmonary ROS,  breath sounds clear to auscultation  Pulmonary exam normal       Cardiovascular Exercise Tolerance: Good negative cardio ROS Normal cardiovascular examRhythm:Regular Rate:Normal     Neuro/Psych negative neurological ROS  negative psych ROS   GI/Hepatic Neg liver ROS, GERD-  Medicated,  Endo/Other  negative endocrine ROS  Renal/GU negative Renal ROS  negative genitourinary   Musculoskeletal negative musculoskeletal ROS (+)   Abdominal (+) + obese,   Peds negative pediatric ROS (+)  Hematology negative hematology ROS (+)   Anesthesia Other Findings   Reproductive/Obstetrics negative OB ROS                             Anesthesia Physical Anesthesia Plan  ASA: II  Anesthesia Plan: General   Post-op Pain Management:    Induction: Intravenous  Airway Management Planned: Oral ETT  Additional Equipment:   Intra-op Plan:   Post-operative Plan: Extubation in OR  Informed Consent: I have reviewed the patients History and Physical, chart, labs and discussed the procedure including the risks, benefits and alternatives for the proposed anesthesia with the patient or authorized representative who has indicated his/her understanding and acceptance.   Dental advisory given  Plan Discussed with: CRNA  Anesthesia Plan Comments:         Anesthesia Quick Evaluation

## 2014-09-07 ENCOUNTER — Encounter (HOSPITAL_BASED_OUTPATIENT_CLINIC_OR_DEPARTMENT_OTHER): Payer: Self-pay | Admitting: *Deleted

## 2014-09-07 ENCOUNTER — Ambulatory Visit (HOSPITAL_BASED_OUTPATIENT_CLINIC_OR_DEPARTMENT_OTHER): Payer: Federal, State, Local not specified - PPO | Admitting: Anesthesiology

## 2014-09-07 ENCOUNTER — Ambulatory Visit (HOSPITAL_BASED_OUTPATIENT_CLINIC_OR_DEPARTMENT_OTHER)
Admission: RE | Admit: 2014-09-07 | Discharge: 2014-09-07 | Disposition: A | Discharge status: Home / Self Care | Payer: Federal, State, Local not specified - PPO | Source: Ambulatory Visit | Attending: Obstetrics and Gynecology | Admitting: Obstetrics and Gynecology

## 2014-09-07 ENCOUNTER — Encounter (HOSPITAL_BASED_OUTPATIENT_CLINIC_OR_DEPARTMENT_OTHER)
Admission: RE | Disposition: A | Discharge status: Home / Self Care | Payer: Self-pay | Source: Ambulatory Visit | Attending: Obstetrics and Gynecology

## 2014-09-07 DIAGNOSIS — N946 Dysmenorrhea, unspecified: Secondary | ICD-10-CM | POA: Diagnosis not present

## 2014-09-07 DIAGNOSIS — K219 Gastro-esophageal reflux disease without esophagitis: Secondary | ICD-10-CM | POA: Insufficient documentation

## 2014-09-07 DIAGNOSIS — G8929 Other chronic pain: Secondary | ICD-10-CM | POA: Insufficient documentation

## 2014-09-07 DIAGNOSIS — R51 Headache: Secondary | ICD-10-CM | POA: Diagnosis not present

## 2014-09-07 DIAGNOSIS — N803 Endometriosis of pelvic peritoneum: Secondary | ICD-10-CM | POA: Diagnosis not present

## 2014-09-07 DIAGNOSIS — R102 Pelvic and perineal pain: Secondary | ICD-10-CM | POA: Diagnosis present

## 2014-09-07 DIAGNOSIS — N941 Dyspareunia: Secondary | ICD-10-CM | POA: Diagnosis not present

## 2014-09-07 DIAGNOSIS — Z79899 Other long term (current) drug therapy: Secondary | ICD-10-CM | POA: Diagnosis not present

## 2014-09-07 DIAGNOSIS — E669 Obesity, unspecified: Secondary | ICD-10-CM | POA: Insufficient documentation

## 2014-09-07 DIAGNOSIS — K589 Irritable bowel syndrome without diarrhea: Secondary | ICD-10-CM | POA: Insufficient documentation

## 2014-09-07 DIAGNOSIS — Z6837 Body mass index (BMI) 37.0-37.9, adult: Secondary | ICD-10-CM | POA: Diagnosis not present

## 2014-09-07 DIAGNOSIS — Z882 Allergy status to sulfonamides status: Secondary | ICD-10-CM | POA: Insufficient documentation

## 2014-09-07 HISTORY — DX: Endometriosis, unspecified: N80.9

## 2014-09-07 HISTORY — DX: Presence of spectacles and contact lenses: Z97.3

## 2014-09-07 HISTORY — DX: Pelvic and perineal pain: R10.2

## 2014-09-07 HISTORY — DX: Pelvic and perineal pain unspecified side: R10.20

## 2014-09-07 HISTORY — PX: LAPAROSCOPY: SHX197

## 2014-09-07 HISTORY — DX: Irritable bowel syndrome, unspecified: K58.9

## 2014-09-07 HISTORY — DX: Gastro-esophageal reflux disease without esophagitis: K21.9

## 2014-09-07 LAB — POCT PREGNANCY, URINE: PREG TEST UR: NEGATIVE

## 2014-09-07 SURGERY — LAPAROSCOPY, DIAGNOSTIC
Anesthesia: General | Site: Abdomen

## 2014-09-07 MED ORDER — FENTANYL CITRATE (PF) 100 MCG/2ML IJ SOLN
INTRAMUSCULAR | Status: DC | PRN
  Administered 2014-09-07 (×2): 50 ug via INTRAVENOUS

## 2014-09-07 MED ORDER — KETOROLAC TROMETHAMINE 30 MG/ML IJ SOLN
INTRAMUSCULAR | Status: DC | PRN
  Administered 2014-09-07: 30 mg via INTRAVENOUS

## 2014-09-07 MED ORDER — BUPIVACAINE HCL (PF) 0.25 % IJ SOLN
INTRAMUSCULAR | Status: DC | PRN
  Administered 2014-09-07: 10 mL

## 2014-09-07 MED ORDER — PROPOFOL 10 MG/ML IV BOLUS
INTRAVENOUS | Status: DC | PRN
  Administered 2014-09-07: 200 mg via INTRAVENOUS
  Administered 2014-09-07: 50 mg via INTRAVENOUS

## 2014-09-07 MED ORDER — OXYCODONE-ACETAMINOPHEN 5-325 MG PO TABS
1.0000 | ORAL_TABLET | ORAL | Status: AC | PRN
  Administered 2014-09-07: 1 via ORAL
  Filled 2014-09-07: qty 2

## 2014-09-07 MED ORDER — NEOSTIGMINE METHYLSULFATE 10 MG/10ML IV SOLN
INTRAVENOUS | Status: DC | PRN
  Administered 2014-09-07: 3 mg via INTRAVENOUS

## 2014-09-07 MED ORDER — OXYCODONE-ACETAMINOPHEN 5-325 MG PO TABS
ORAL_TABLET | ORAL | Status: AC
  Filled 2014-09-07: qty 1

## 2014-09-07 MED ORDER — FENTANYL CITRATE (PF) 100 MCG/2ML IJ SOLN
INTRAMUSCULAR | Status: AC
  Filled 2014-09-07: qty 4

## 2014-09-07 MED ORDER — LACTATED RINGERS IV SOLN
INTRAVENOUS | Status: DC
  Administered 2014-09-07 (×3): via INTRAVENOUS
  Filled 2014-09-07: qty 1000

## 2014-09-07 MED ORDER — IBUPROFEN 800 MG PO TABS: 800.0000 mg | ORAL_TABLET | Freq: Three times a day (TID) | ORAL | Status: DC | PRN

## 2014-09-07 MED ORDER — ONDANSETRON HCL 4 MG/2ML IJ SOLN
INTRAMUSCULAR | Status: DC | PRN
  Administered 2014-09-07: 4 mg via INTRAVENOUS

## 2014-09-07 MED ORDER — MIDAZOLAM HCL 5 MG/5ML IJ SOLN
INTRAMUSCULAR | Status: DC | PRN
  Administered 2014-09-07: 2 mg via INTRAVENOUS

## 2014-09-07 MED ORDER — DEXAMETHASONE SODIUM PHOSPHATE 4 MG/ML IJ SOLN
INTRAMUSCULAR | Status: DC | PRN
  Administered 2014-09-07: 10 mg via INTRAVENOUS

## 2014-09-07 MED ORDER — GLYCOPYRROLATE 0.2 MG/ML IJ SOLN
INTRAMUSCULAR | Status: DC | PRN
  Administered 2014-09-07: 0.4 mg via INTRAVENOUS

## 2014-09-07 MED ORDER — MIDAZOLAM HCL 2 MG/2ML IJ SOLN
INTRAMUSCULAR | Status: AC
Start: 2014-09-07 — End: 2014-09-07
  Filled 2014-09-07: qty 2

## 2014-09-07 MED ORDER — PROMETHAZINE HCL 25 MG/ML IJ SOLN
6.2500 mg | INTRAMUSCULAR | Status: DC | PRN
  Filled 2014-09-07: qty 1

## 2014-09-07 MED ORDER — LIDOCAINE HCL 4 % MT SOLN
OROMUCOSAL | Status: DC | PRN
  Administered 2014-09-07: 2.5 mL via TOPICAL

## 2014-09-07 MED ORDER — ROCURONIUM BROMIDE 100 MG/10ML IV SOLN
INTRAVENOUS | Status: DC | PRN
  Administered 2014-09-07: 25 mg via INTRAVENOUS

## 2014-09-07 MED ORDER — FENTANYL CITRATE (PF) 100 MCG/2ML IJ SOLN
25.0000 ug | INTRAMUSCULAR | Status: DC | PRN
  Filled 2014-09-07: qty 1

## 2014-09-07 MED ORDER — HYDROCODONE-IBUPROFEN 7.5-200 MG PO TABS: 1.0000 | ORAL_TABLET | Freq: Three times a day (TID) | ORAL | Status: DC | PRN

## 2014-09-07 SURGICAL SUPPLY — 63 items
APPLICATOR COTTON TIP 6IN STRL (MISCELLANEOUS) ×2 IMPLANT
BAG URINE DRAINAGE (UROLOGICAL SUPPLIES) IMPLANT
BANDAGE ADHESIVE 1X3 (GAUZE/BANDAGES/DRESSINGS) IMPLANT
BLADE CLIPPER SURG (BLADE) IMPLANT
BLADE SURG 11 STRL SS (BLADE) ×2 IMPLANT
CANISTER SUCTION 2500CC (MISCELLANEOUS) IMPLANT
CATH FOLEY 2WAY SLVR  5CC 14FR (CATHETERS)
CATH FOLEY 2WAY SLVR 5CC 14FR (CATHETERS) IMPLANT
CATH ROBINSON RED A/P 16FR (CATHETERS) IMPLANT
CLOTH BEACON ORANGE TIMEOUT ST (SAFETY) ×2 IMPLANT
COVER MAYO STAND STRL (DRAPES) ×2 IMPLANT
DRAPE UNDERBUTTOCKS STRL (DRAPE) ×2 IMPLANT
DRSG OPSITE POSTOP 3X4 (GAUZE/BANDAGES/DRESSINGS) ×2 IMPLANT
ELECT REM PT RETURN 9FT ADLT (ELECTROSURGICAL) ×2
ELECTRODE REM PT RTRN 9FT ADLT (ELECTROSURGICAL) ×1 IMPLANT
FILTER SMOKE EVAC LAPAROSHD (FILTER) IMPLANT
GLOVE BIO SURGEON STRL SZ7 (GLOVE) ×4 IMPLANT
GLOVE BIOGEL M 6.5 STRL (GLOVE) ×2 IMPLANT
GLOVE INDICATOR 6.5 STRL GRN (GLOVE) ×2 IMPLANT
GLOVE INDICATOR 7.5 STRL GRN (GLOVE) ×2 IMPLANT
GOWN STRL REUS W/ TWL LRG LVL3 (GOWN DISPOSABLE) ×2 IMPLANT
GOWN STRL REUS W/TWL LRG LVL3 (GOWN DISPOSABLE) ×2
LEGGING LITHOTOMY PAIR STRL (DRAPES) ×2 IMPLANT
LIQUID BAND (GAUZE/BANDAGES/DRESSINGS) ×2 IMPLANT
MANIFOLD NEPTUNE II (INSTRUMENTS) IMPLANT
NEEDLE HYPO 25X1 1.5 SAFETY (NEEDLE) ×2 IMPLANT
NEEDLE INSUFFLATION 14GA 120MM (NEEDLE) ×2 IMPLANT
NEEDLE INSUFFLATION 14GA 150MM (NEEDLE) IMPLANT
NS IRRIG 500ML POUR BTL (IV SOLUTION) ×2 IMPLANT
PACK BASIN DAY SURGERY FS (CUSTOM PROCEDURE TRAY) ×2 IMPLANT
PACK LAPAROSCOPY II (CUSTOM PROCEDURE TRAY) ×2 IMPLANT
PAD OB MATERNITY 4.3X12.25 (PERSONAL CARE ITEMS) ×2 IMPLANT
PAD PREP 24X48 CUFFED NSTRL (MISCELLANEOUS) ×2 IMPLANT
PADDING ION DISPOSABLE (MISCELLANEOUS) ×2 IMPLANT
PENCIL BUTTON HOLSTER BLD 10FT (ELECTRODE) IMPLANT
POUCH SPECIMEN RETRIEVAL 10MM (ENDOMECHANICALS) IMPLANT
SCALPEL HARMONIC ACE (MISCELLANEOUS) IMPLANT
SCISSORS LAP 5X35 DISP (ENDOMECHANICALS) IMPLANT
SEALER TISSUE G2 CVD JAW 35 (ENDOMECHANICALS) IMPLANT
SEALER TISSUE G2 CVD JAW 45CM (ENDOMECHANICALS) IMPLANT
SET IRRIG TUBING LAPAROSCOPIC (IRRIGATION / IRRIGATOR) IMPLANT
SOLUTION ANTI FOG 6CC (MISCELLANEOUS) ×2 IMPLANT
STRIP CLOSURE SKIN 1/4X4 (GAUZE/BANDAGES/DRESSINGS) IMPLANT
SUT MNCRL AB 3-0 PS2 18 (SUTURE) IMPLANT
SUT MNCRL AB 4-0 PS2 18 (SUTURE) ×2 IMPLANT
SUT MON AB 2-0 CT1 36 (SUTURE) IMPLANT
SUT PLAIN 4 0 FS 2 27 (SUTURE) IMPLANT
SUT VIC AB 2-0 UR6 27 (SUTURE) ×2 IMPLANT
SUT VICRYL 0 TIES 12 18 (SUTURE) IMPLANT
SUT VICRYL 0 UR6 27IN ABS (SUTURE) IMPLANT
SUT VICRYL RAPIDE 3 0 (SUTURE) IMPLANT
SUT VICRYL RAPIDE 4/0 PS 2 (SUTURE) ×2 IMPLANT
SYR 3ML 23GX1 SAFETY (SYRINGE) IMPLANT
SYR CONTROL 10ML LL (SYRINGE) ×2 IMPLANT
SYRINGE 10CC LL (SYRINGE) ×2 IMPLANT
TOWEL OR 17X24 6PK STRL BLUE (TOWEL DISPOSABLE) ×4 IMPLANT
TRAY DSU PREP LF (CUSTOM PROCEDURE TRAY) ×2 IMPLANT
TROCAR OPTI TIP 5M 100M (ENDOMECHANICALS) ×2 IMPLANT
TROCAR XCEL BLUNT TIP 100MML (ENDOMECHANICALS) IMPLANT
TROCAR XCEL DIL TIP R 11M (ENDOMECHANICALS) ×2 IMPLANT
TUBING INSUFFLATION 10FT LAP (TUBING) ×2 IMPLANT
VACUUM HOSE/TUBING 7/8INX6FT (MISCELLANEOUS) IMPLANT
WATER STERILE IRR 500ML POUR (IV SOLUTION) ×2 IMPLANT

## 2014-09-07 NOTE — Anesthesia Postprocedure Evaluation (Signed)
  Anesthesia Post-op Note  Patient: Stephanie Acosta  Procedure(s) Performed: Procedure(s) (LRB): LAPAROSCOPY DIAGNOSTIC (N/A)  Patient Location: PACU  Anesthesia Type: General  Level of Consciousness: awake and alert   Airway and Oxygen Therapy: Patient Spontanous Breathing  Post-op Pain: mild  Post-op Assessment: Post-op Vital signs reviewed, Patient's Cardiovascular Status Stable, Respiratory Function Stable, Patent Airway and No signs of Nausea or vomiting  Last Vitals:  Filed Vitals:   09/07/14 1015  BP: 115/58  Pulse: 65  Temp:   Resp: 11    Post-op Vital Signs: stable   Complications: No apparent anesthesia complications

## 2014-09-07 NOTE — Transfer of Care (Signed)
Immediate Anesthesia Transfer of Care Note  Patient: Stephanie Acosta  Procedure(s) Performed: Procedure(s): LAPAROSCOPY DIAGNOSTIC (N/A)  Patient Location: PACU  Anesthesia Type:General  Level of Consciousness: awake, alert , oriented and patient cooperative  Airway & Oxygen Therapy: Patient Spontanous Breathing and Patient connected to nasal cannula oxygen  Post-op Assessment: Report given to RN and Post -op Vital signs reviewed and stable  Post vital signs: Reviewed and stable  Last Vitals:  Filed Vitals:   09/07/14 0703  BP: 122/65  Pulse: 78  Temp: 37 C  Resp: 16    Complications: No apparent anesthesia complications

## 2014-09-07 NOTE — Discharge Instructions (Signed)
HOME CARE INSTRUCTIONS - LAPAROSCOPY  Wound Care: The bandaids or dressing which are placed over the skin openings may be removed the day after surgery. The incision should be kept clean and dry. The stitches do not need to be removed. Should the incision become sore, red, and swollen after the first week, check with your doctor.  Personal Hygiene: Shower the day after your procedure. Always wipe from front to back after elimination.   Activity: Do not drive or operate any equipment today. The effects of the anesthesia are still present and drowsiness may result. Rest today, not necessarily flat bed rest, just take it easy. You may resume your normal activity in one to three days or as instructed by your physician.  Sexual Activity: You resume sexual activity as indicated by your physician. If your laparoscopy was for a sterilization ( tubes tied ), continue current method of birth control until after your next period or ask for specific instructions from your doctor.  Diet: Eat a light diet as desired this evening. You may resume a regular diet tomorrow.  Return to Work: Two to three days or as indicated by your doctor.  Expectations After Surgery: Your surgery will cause vaginal drainage or spotting which may continue for 2-3 days. Mild abdominal discomfort or tenderness is not unusual and some shoulder pain may also be noted which can be relieved by lying flat in pain.  Call Your Doctor If these Occur:  Persistent or heavy bleeding at incision site       Redness or swelling around incision       Elevation of temperature greater than 100 degrees F  Call for follow-up appointment .     Post Anesthesia Home Care Instructions  Activity: Get plenty of rest for the remainder of the day. A responsible adult should stay with you for 24 hours following the procedure.  For the next 24 hours, DO NOT: -Drive a car -Paediatric nurse -Drink alcoholic beverages -Take any medication unless  instructed by your physician -Make any legal decisions or sign important papers.  Meals: Start with liquid foods such as gelatin or soup. Progress to regular foods as tolerated. Avoid greasy, spicy, heavy foods. If nausea and/or vomiting occur, drink only clear liquids until the nausea and/or vomiting subsides. Call your physician if vomiting continues.  Special Instructions/Symptoms: Your throat may feel dry or sore from the anesthesia or the breathing tube placed in your throat during surgery. If this causes discomfort, gargle with warm salt water. The discomfort should disappear within 24 hours.  If you had a scopolamine patch placed behind your ear for the management of post- operative nausea and/or vomiting:  1. The medication in the patch is effective for 72 hours, after which it should be removed.  Wrap patch in a tissue and discard in the trash. Wash hands thoroughly with soap and water. 2. You may remove the patch earlier than 72 hours if you experience unpleasant side effects which may include dry mouth, dizziness or visual disturbances. 3. Avoid touching the patch. Wash your hands with soap and water after contact with the patch.

## 2014-09-07 NOTE — Progress Notes (Signed)
The patient was re-examined with no change in status 

## 2014-09-07 NOTE — Anesthesia Procedure Notes (Signed)
Procedure Name: Intubation Date/Time: 09/07/2014 8:30 AM Performed by: Wanita Chamberlain Pre-anesthesia Checklist: Patient identified, Timeout performed, Emergency Drugs available, Suction available and Patient being monitored Patient Re-evaluated:Patient Re-evaluated prior to inductionOxygen Delivery Method: Circle system utilized Preoxygenation: Pre-oxygenation with 100% oxygen Intubation Type: IV induction Ventilation: Mask ventilation without difficulty Laryngoscope Size: Mac and 3 Grade View: Grade II Tube type: Oral Tube size: 7.0 mm Number of attempts: 2 Airway Equipment and Method: Bite block and Stylet Placement Confirmation: ETT inserted through vocal cords under direct vision,  breath sounds checked- equal and bilateral and positive ETCO2 Secured at: 21 cm Tube secured with: Tape

## 2014-09-07 NOTE — Op Note (Signed)
Preoperative diagnosis: Pelvic pain, dyspareunia  Postoperative diagnosis: Same, minimal endometriosis  Procedure: Diagnostic laparoscopy  Surgeon: Matthew Saras  Anesthesia: Gen.  EBL: Less than 10 cc  Procedure and findings:  Patient taken the operating room after an adequate level of general anesthesia was obtained with the patient's leg stirrups the abdomen perineum and vagina were prepped and draped in the usual fashion. The bladder was drained. EUA carried out uterus was small, mid position, mobile, adnexa negative. Hulka tenaculum was positioned this was done after appropriate timeouts were taken.  Attention directed to the abdomen where the subumbilical area was infiltrated with quarter percent Marcaine plain. Small incision was made in the varies needle was introduced without difficulty. Its intra-abdominal position was verified by pressure water testing. After a 3 L pneumoperitoneum syncopated, lap scopic trocar and sleeve were inserted, there was no evidence of any bleeding or trauma. 3 finger breaths above the symphysis in the midline a 5 mm trocar was inserted under direct visualization. The patient was then placed in Trendelenburg and the pelvic findings as follows:  The upper abdomen liver edge appendix were unremarkable.  In the pelvis the anterior cul-de-sac uterine serosa bilateral adnexa unremarkable. In the area the uterosacral ligaments there were some early stage whitish/clear vesicular areas consistent with early stage endometriosis. I did not see any blackish/brownish areas of more active disease. Broad ligament area the course of the ureter on each side was unremarkable. After these findings were photo documented, and stress removed, gas allowed to escape, subumbilical incision closed with a 4-0 subcuticular suture and Dermabond on the lower. She tolerated this well went to recovery room in good condition.  Dictated with BufordD.

## 2014-09-08 ENCOUNTER — Encounter (HOSPITAL_BASED_OUTPATIENT_CLINIC_OR_DEPARTMENT_OTHER): Payer: Self-pay | Admitting: Obstetrics and Gynecology

## 2015-08-03 ENCOUNTER — Ambulatory Visit: Payer: Federal, State, Local not specified - PPO | Admitting: Neurology

## 2015-11-22 ENCOUNTER — Telehealth (HOSPITAL_COMMUNITY): Payer: Self-pay | Admitting: *Deleted

## 2015-11-22 NOTE — Telephone Encounter (Signed)
LEFT VOICE MESSAGE REGARDING APPOINTMENT

## 2015-11-28 ENCOUNTER — Telehealth (HOSPITAL_COMMUNITY): Payer: Self-pay | Admitting: *Deleted

## 2015-11-28 NOTE — Telephone Encounter (Signed)
LEFT VOICE MESSAGE REGARDING AN APPOINTMENT.

## 2015-12-11 ENCOUNTER — Ambulatory Visit (HOSPITAL_COMMUNITY): Payer: Self-pay | Admitting: Psychiatry

## 2015-12-11 ENCOUNTER — Telehealth (HOSPITAL_COMMUNITY): Payer: Self-pay | Admitting: *Deleted

## 2015-12-11 NOTE — Telephone Encounter (Signed)
Called pt to resch appt for 12-11-2015 due to provider feeling sick and out of office. [email or web address]

## 2015-12-13 ENCOUNTER — Telehealth (HOSPITAL_COMMUNITY): Payer: Self-pay | Admitting: *Deleted

## 2015-12-13 NOTE — Telephone Encounter (Signed)
Returned phone call to patient regarding an appointment.

## 2016-02-19 ENCOUNTER — Telehealth (HOSPITAL_COMMUNITY): Payer: Self-pay | Admitting: *Deleted

## 2016-02-19 NOTE — Telephone Encounter (Signed)
returned phone call, left voice message with appointment date and time.

## 2016-02-22 ENCOUNTER — Ambulatory Visit (HOSPITAL_COMMUNITY): Payer: Self-pay | Admitting: Psychiatry

## 2016-02-26 ENCOUNTER — Telehealth (HOSPITAL_COMMUNITY): Payer: Self-pay | Admitting: *Deleted

## 2016-02-26 NOTE — Telephone Encounter (Signed)
Phone call from patient to reschedule appointment missed due to weather.    Patient requested a Thursday afternoon which was in March.   She declined that day and asked for something earlier.   She was offered 03/20/16 and declined that day, said she had to work.   She said she would call back when she gets a better look at her schedule.

## 2016-04-02 ENCOUNTER — Encounter (HOSPITAL_COMMUNITY): Payer: Self-pay | Admitting: Psychiatry

## 2016-04-02 ENCOUNTER — Ambulatory Visit (INDEPENDENT_AMBULATORY_CARE_PROVIDER_SITE_OTHER): Payer: Federal, State, Local not specified - PPO | Admitting: Psychiatry

## 2016-04-02 DIAGNOSIS — Z818 Family history of other mental and behavioral disorders: Secondary | ICD-10-CM | POA: Diagnosis not present

## 2016-04-02 DIAGNOSIS — Z9889 Other specified postprocedural states: Secondary | ICD-10-CM

## 2016-04-02 DIAGNOSIS — F321 Major depressive disorder, single episode, moderate: Secondary | ICD-10-CM

## 2016-04-02 DIAGNOSIS — Z79899 Other long term (current) drug therapy: Secondary | ICD-10-CM

## 2016-04-02 DIAGNOSIS — F329 Major depressive disorder, single episode, unspecified: Secondary | ICD-10-CM | POA: Insufficient documentation

## 2016-04-02 DIAGNOSIS — Z882 Allergy status to sulfonamides status: Secondary | ICD-10-CM

## 2016-04-02 DIAGNOSIS — Z811 Family history of alcohol abuse and dependence: Secondary | ICD-10-CM | POA: Diagnosis not present

## 2016-04-02 MED ORDER — ESCITALOPRAM OXALATE 10 MG PO TABS: 10.0000 mg | ORAL_TABLET | Freq: Every day | ORAL | 2 refills | Status: DC

## 2016-04-02 MED ORDER — CLONAZEPAM 0.5 MG PO TABS: 0.5000 mg | ORAL_TABLET | Freq: Every day | ORAL | 2 refills | Status: DC | PRN

## 2016-04-02 NOTE — Progress Notes (Signed)
Psychiatric Initial Adult Assessment   Patient Identification: Stephanie Acosta MRN:  [id number] Date of Evaluation:  04/02/2016 Referral Source:[facility] Chief Complaint:   Chief Complaint    Depression; Anxiety; Establish Care     Visit Diagnosis:    ICD-9-CM ICD-10-CM   1. Moderate single current episode of major depressive disorder (HCC) 296.22 F32.1     History of Present Illness:  This patient is a 26 year old married white female who lives with her husband, 5-year-old daughter and 41-year-old son in [place]. She works as an Corporate treasurer for the [employer]  The patient was referred by her physician at the [facility] for further assessment and treatment of depression and anxiety.  The patient states that she is always been somewhat depressed. Her mother drank too much and her parents divorced when she was in the seventh grade and she had a younger sister. Her mother moved away and they were left with her father. This was hard on her and her sister as well. Her father was very overprotective and didn't allow them to do much with friends. The patient was always a very responsible child during her last year for high school she began taking care of her elderly grandparents and did this the year after high school as well. She went right from this to getting engaged to her current husband having a baby at age 89 and attending nursing school at [employer],  The patient's life demands have become increasingly difficult. She and her husband have gone through financial crises at times and had to move in with her in-laws. They're doing better now. Her father-in-law died last year which was hard on the entire family. Both of the grandparents that she cared for have died as well. She finds that her work is very demanding and she is sometimes gets off task and gets in trouble. Her 65-year-old daughter has been having issues with her stomach and is vomiting a lot at school has had to have a  good deal of medical workup. She feels guilty for not being home with her daughter but realizes that she has to work. Over the last years she's had increasing symptoms of panic attacks. Over the last several months her depression is worsened and she is crying frequently having difficulty sleeping feeling sad and guilty not eating well and losing 12 pounds in the last 2 months. She is terrified of dying and yet at times thinks her family would be better off without her. She's does not have any plans to harm herself. She's having a good deal of trouble focusing and concentrating.  The patient has been tried on Cymbalta which causes drowsiness Paxil which didn't help Elavil which causes drowsiness. She is tried BuSpar 5 mg but only uses it occasionally. She does not use any benzodiazepine so far has not had any counseling throughout her life. She only drinks alcohol very rarely and does not use drugs or smoke cigarettes      Associated Signs/Symptoms: Depression Symptoms:  depressed mood, anhedonia, insomnia, psychomotor retardation, feelings of worthlessness/guilt, difficulty concentrating, anxiety, panic attacks, (Hypo) Manic Symptoms:  Distractibility, Irritable Mood, Anxiety Symptoms:  Excessive Worry, Panic Symptoms, Psychotic Symptoms: none PTSD Symptoms: none  Past Psychiatric History: none  Previous Psychotropic Medications: Yes   Substance Abuse History in the last 12 months:  No.  Consequences of Substance Abuse: NA  Past Medical History:  Past Medical History:  Diagnosis Date  . Anxiety   . Celiac disease sept 2015  .  Depression   . Endometriosis   . GERD (gastroesophageal reflux disease)   . IBS (irritable bowel syndrome)   . PCOS (polycystic ovarian syndrome) 2013  . Pelvic pain in female   . Wears contact lenses     Past Surgical History:  Procedure Laterality Date  . DILATION AND EVACUATION N/A 07/09/2012   Procedure: DILATATION AND EVACUATION;  Surgeon:  Margarette Asal, MD;  Location: [facility];  Service: Gynecology;  Laterality: N/A;  . LAPAROSCOPY N/A 09/07/2014   Procedure: LAPAROSCOPY DIAGNOSTIC;  Surgeon: Molli Posey, MD;  Location: [facility];  Service: Gynecology;  Laterality: N/A;  . WISDOM TOOTH EXTRACTION      Family Psychiatric History: Patient's mother has a history of alcohol abuse and depression. 4 of the mother's brothers also abuse alcohol  Family History:  Family History  Problem Relation Age of Onset  . Diabetes Maternal Grandfather   . Diabetes Paternal Grandmother   . Depression Mother   . Alcohol abuse Mother   . Bipolar disorder Maternal Aunt   . Alcohol abuse Maternal Uncle   . Alcohol abuse Maternal Uncle   . Alcohol abuse Maternal Uncle   . Alcohol abuse Maternal Uncle     Social History:   Social History   Social History  . Marital status: Married    Spouse name: N/A  . Number of children: N/A  . Years of education: N/A   Social History Main Topics  . Smoking status: Never Smoker  . Smokeless tobacco: Never Used  . Alcohol use Yes     Comment: occas glass of wine  . Drug use: No  . Sexual activity: Yes    Birth control/ protection: IUD   Other Topics Concern  . None   Social History Narrative  . None    Additional Social History: Patient lives with her husband and 2 children. She works as an Corporate treasurer for [employer]. She states that her marriage is good and she is always time with her family when they're able to have time together which is infrequent. She's gone through a lot of losses of significant family members in the last couple of years. She's never been a victim of abuse or trauma  Allergies:   Allergies  Allergen Reactions  . Sulfa Antibiotics Rash    Childhood reaction    Metabolic Disorder Labs: No results found for: HGBA1C, MPG No results found for: PROLACTIN No results found for: CHOL, TRIG, HDL, CHOLHDL, VLDL, LDLCALC   Current Medications: Current  Outpatient Prescriptions  Medication Sig Dispense Refill  . clonazePAM (KLONOPIN) 0.5 MG tablet Take 1 tablet (0.5 mg total) by mouth daily as needed for anxiety. 30 tablet 2  . escitalopram (LEXAPRO) 10 MG tablet Take 1 tablet (10 mg total) by mouth daily. 30 tablet 2  . ibuprofen (ADVIL,MOTRIN) 800 MG tablet Take 1 tablet (800 mg total) by mouth every 8 (eight) hours as needed for moderate pain. 30 tablet 1   No current facility-administered medications for this visit.     Neurologic: Headache: Yes Seizure: No Paresthesias:No  Musculoskeletal: Strength & Muscle Tone: within normal limits Gait & Station: normal Patient leans: N/A  Psychiatric Specialty Exam: Review of Systems  Constitutional: Positive for weight loss.  Gastrointestinal: Positive for abdominal pain.  Neurological: Positive for headaches.  Psychiatric/Behavioral: Positive for depression. The patient is nervous/anxious and has insomnia.   All other systems reviewed and are negative.   Blood pressure 128/80, height 5' 4"  (1.626 m), weight  201 lb (91.2 kg).Body mass index is 34.5 kg/m.  General Appearance: Neat and Well Groomed  Eye Contact:  Good  Speech:  Clear and Coherent  Volume:  Normal  Mood:  Anxious and Depressed  Affect:  Constricted  Thought Process:  Goal Directed and Descriptions of Associations: Intact  Orientation:  Full (Time, Place, and Person)  Thought Content:  Logical  Suicidal Thoughts:  No  Homicidal Thoughts:  No  Memory:  Immediate;   Good Recent;   Good Remote;   Good  Judgement:  Good  Insight:  Good  Psychomotor Activity:  Normal  Concentration:  Concentration: Poor and Attention Span: Poor  Recall:  Good  Fund of Knowledge:Good  Language: Good  Akathisia:  No  Handed:  Right  AIMS (if indicated):    Assets:  Communication Skills Desire for Improvement Physical Health Resilience Social Support Vocational/Educational  ADL's:  Intact  Cognition: WNL  Sleep:  poor     Treatment Plan Summary: Medication management   Patient is a 27 year old female who has become overly stressed with the demands of family work in addition to numerous losses of family members of the last couple of years. She also has a strong family history of depression. At this point I suggested we try low-dose of Lexapro to help depression and anxiety at 10 mg. As well as clonazepam 0.5 mg daily as needed for breakthrough panic attacks. She'll also start counseling here with [facility] and return to see me in 4 weeks   Levonne Spiller, MD 2/27/201810:01 AM

## 2016-05-02 ENCOUNTER — Encounter (HOSPITAL_COMMUNITY): Payer: Self-pay | Admitting: Psychiatry

## 2016-05-02 ENCOUNTER — Ambulatory Visit (INDEPENDENT_AMBULATORY_CARE_PROVIDER_SITE_OTHER): Payer: Federal, State, Local not specified - PPO | Admitting: Psychiatry

## 2016-05-02 VITALS — BP 104/52 | HR 81 | Wt 204.6 lb

## 2016-05-02 DIAGNOSIS — F321 Major depressive disorder, single episode, moderate: Secondary | ICD-10-CM

## 2016-05-02 DIAGNOSIS — Z79899 Other long term (current) drug therapy: Secondary | ICD-10-CM | POA: Diagnosis not present

## 2016-05-02 DIAGNOSIS — Z818 Family history of other mental and behavioral disorders: Secondary | ICD-10-CM | POA: Diagnosis not present

## 2016-05-02 DIAGNOSIS — Z811 Family history of alcohol abuse and dependence: Secondary | ICD-10-CM

## 2016-05-02 MED ORDER — ESCITALOPRAM OXALATE 20 MG PO TABS: 20.0000 mg | ORAL_TABLET | Freq: Every day | ORAL | 2 refills | Status: DC

## 2016-05-02 NOTE — Progress Notes (Signed)
Psychiatric Initial Adult Assessment   Patient Identification: Stephanie Acosta MRN:  [id number] Date of Evaluation:  05/02/2016 Referral Source:[facility] Chief Complaint:   Chief Complaint    Depression; Anxiety; Follow-up     Visit Diagnosis:    ICD-9-CM ICD-10-CM   1. Moderate single current episode of major depressive disorder (HCC) 296.22 F32.1     History of Present Illness:  This patient is a 26 year old married white female who lives with her husband, 91-year-old daughter and 50-year-old son in [place]. 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  The patient states that she is always been somewhat depressed. Her mother drank too much and her parents divorced when she was in the seventh grade and she had a younger sister. Her mother moved away and they were left with her father. This was hard on her and her sister as well. Her father was very overprotective and didn't allow them to do much with friends. The patient was always a very responsible child during her last year for high school she began taking care of her elderly grandparents and did this the year after high school as well. She went right from this to getting engaged to her current husband having a baby at age 46 and attending nursing school at [employer],  The patient's life demands have become increasingly difficult. She and her husband have gone through financial crises at times and had to move in with her in-laws. They're doing better now. Her father-in-law died last year which was hard on the entire family. Both of the grandparents that she cared for have died as well. She finds that her work is very demanding and she is sometimes gets off task and gets in trouble. Her 35-year-old daughter has been having issues with her stomach and is vomiting a lot at school has had to have a good  deal of medical workup. She feels guilty for not being home with her daughter but realizes that she has to work. Over the last years she's had increasing symptoms of panic attacks. Over the last several months her depression is worsened and she is crying frequently having difficulty sleeping feeling sad and guilty not eating well and losing 12 pounds in the last 2 months. She is terrified of dying and yet at times thinks her family would be better off without her. She's does not have any plans to harm herself. She's having a good deal of trouble focusing and concentrating.  The patient has been tried on Cymbalta which causes drowsiness Paxil which didn't help Elavil which causes drowsiness. She is tried BuSpar 5 mg but only uses it occasionally. She does not use any benzodiazepine so far has not had any counseling throughout her life. She only drinks alcohol very rarely and does not use drugs or smoke cigarettes  The patient returns after 4 weeks. She is now on Lexapro 10 mg daily as well as clonazepam 0.5 mg twice a day. Overall she is feeling better but still somewhat stressed. Her daughter's health has improved but the patient herself got corneal abrasion from new contacts and had to miss some time off of work. She states she is being closely monitored at work because of unexcused absences and this is making her nervous. She's only had to use the clonazepam a couple of times. She's not crying it is much but still  somewhat anxious and I suggested that we go up on the Lexapro to 20 mg and she agrees      Associated Signs/Symptoms: Depression Symptoms:  depressed mood, anhedonia, insomnia, psychomotor retardation, feelings of worthlessness/guilt, difficulty concentrating, anxiety, panic attacks, (Hypo) Manic Symptoms:  Distractibility, Irritable Mood, Anxiety Symptoms:  Excessive Worry, Panic Symptoms, Psychotic Symptoms: none PTSD Symptoms: none  Past Psychiatric History: none  Previous  Psychotropic Medications: Yes   Substance Abuse History in the last 12 months:  No.  Consequences of Substance Abuse: NA  Past Medical History:  Past Medical History:  Diagnosis Date  . Anxiety   . Celiac disease sept 2015  . Depression   . Endometriosis   . GERD (gastroesophageal reflux disease)   . IBS (irritable bowel syndrome)   . PCOS (polycystic ovarian syndrome) 2013  . Pelvic pain in female   . Wears contact lenses     Past Surgical History:  Procedure Laterality Date  . DILATION AND EVACUATION N/A 07/09/2012   Procedure: DILATATION AND EVACUATION;  Surgeon: Margarette Asal, MD;  Location: [facility];  Service: Gynecology;  Laterality: N/A;  . LAPAROSCOPY N/A 09/07/2014   Procedure: LAPAROSCOPY DIAGNOSTIC;  Surgeon: Molli Posey, MD;  Location: [facility];  Service: Gynecology;  Laterality: N/A;  . WISDOM TOOTH EXTRACTION      Family Psychiatric History: Patient's mother has a history of alcohol abuse and depression. 4 of the mother's brothers also abuse alcohol  Family History:  Family History  Problem Relation Age of Onset  . Diabetes Maternal Grandfather   . Diabetes Paternal Grandmother   . Depression Mother   . Alcohol abuse Mother   . Bipolar disorder Maternal Aunt   . Alcohol abuse Maternal Uncle   . Alcohol abuse Maternal Uncle   . Alcohol abuse Maternal Uncle   . Alcohol abuse Maternal Uncle     Social History:   Social History   Social History  . Marital status: Married    Spouse name: N/A  . Number of children: N/A  . Years of education: N/A   Social History Main Topics  . Smoking status: Never Smoker  . Smokeless tobacco: Never Used  . Alcohol use Yes     Comment: occas glass of wine  . Drug use: No  . Sexual activity: Yes    Birth control/ protection: IUD   Other Topics Concern  . Not on file   Social History Narrative  . No narrative on file    Additional Social History: Patient lives with her husband and 2  children. She works as an Corporate treasurer for [employer]. She states that her marriage is good and she is always time with her family when they're able to have time together which is infrequent. She's gone through a lot of losses of significant family members in the last couple of years. She's never been a victim of abuse or trauma  Allergies:   Allergies  Allergen Reactions  . Sulfa Antibiotics Rash    Childhood reaction    Metabolic Disorder Labs: No results found for: HGBA1C, MPG No results found for: PROLACTIN No results found for: CHOL, TRIG, HDL, CHOLHDL, VLDL, LDLCALC   Current Medications: Current Outpatient Prescriptions  Medication Sig Dispense Refill  . clonazePAM (KLONOPIN) 0.5 MG tablet Take 1 tablet (0.5 mg total) by mouth daily as needed for anxiety. 30 tablet 2  . escitalopram (LEXAPRO) 20 MG tablet Take 1 tablet (20 mg total) by mouth daily. 30 tablet 2  .  ibuprofen (ADVIL,MOTRIN) 800 MG tablet Take 1 tablet (800 mg total) by mouth every 8 (eight) hours as needed for moderate pain. 30 tablet 1   No current facility-administered medications for this visit.     Neurologic: Headache: Yes Seizure: No Paresthesias:No  Musculoskeletal: Strength & Muscle Tone: within normal limits Gait & Station: normal Patient leans: N/A  Psychiatric Specialty Exam: Review of Systems  Constitutional: Positive for weight loss.  Gastrointestinal: Positive for abdominal pain.  Neurological: Positive for headaches.  Psychiatric/Behavioral: Positive for depression. The patient is nervous/anxious and has insomnia.   All other systems reviewed and are negative.   Blood pressure (!) 104/52, pulse 81, weight 204 lb 9.6 oz (92.8 kg).Body mass index is 35.12 kg/m.  General Appearance: Neat and Well Groomed  Eye Contact:  Good  Speech:  Clear and Coherent  Volume:  Normal  Mood:  A little anxious but improved   Affect:  Subdued   Thought Process:  Goal Directed and Descriptions of  Associations: Intact  Orientation:  Full (Time, Place, and Person)  Thought Content:  Logical  Suicidal Thoughts:  No  Homicidal Thoughts:  No  Memory:  Immediate;   Good Recent;   Good Remote;   Good  Judgement:  Good  Insight:  Good  Psychomotor Activity:  Normal  Concentration:  Concentration: Poor and Attention Span: Poor  Recall:  Good  Fund of Knowledge:Good  Language: Good  Akathisia:  No  Handed:  Right  AIMS (if indicated):    Assets:  Communication Skills Desire for Improvement Physical Health Resilience Social Support Vocational/Educational  ADL's:  Intact  Cognition: WNL  Sleep:  poor    Treatment Plan Summary: Medication management   The patient will increase Lexapro to 20 mg daily and continue clonazepam 0.5 mg as needed for anxiety. She'll return to see me in 6 weeks  Levonne Spiller, MD 3/29/20184:26 PM

## 2016-05-24 ENCOUNTER — Other Ambulatory Visit (HOSPITAL_COMMUNITY): Payer: Self-pay | Admitting: Psychiatry

## 2016-05-24 ENCOUNTER — Telehealth (HOSPITAL_COMMUNITY): Payer: Self-pay | Admitting: *Deleted

## 2016-05-24 MED ORDER — ESCITALOPRAM OXALATE 20 MG PO TABS: 20.0000 mg | ORAL_TABLET | Freq: Every day | ORAL | 2 refills | Status: DC

## 2016-05-24 NOTE — Telephone Encounter (Signed)
phone call from patient and ask if her escitalopram (LEXAPRO) 20 MG tablet could be resent to pharmacy.   She said she didn't pick it up in time and they put it back.

## 2016-05-24 NOTE — Telephone Encounter (Signed)
noted 

## 2016-05-24 NOTE — Telephone Encounter (Signed)
sent 

## 2016-06-13 ENCOUNTER — Ambulatory Visit (HOSPITAL_COMMUNITY): Payer: Self-pay | Admitting: Psychiatry

## 2016-08-18 ENCOUNTER — Encounter (HOSPITAL_COMMUNITY): Payer: Self-pay | Admitting: Emergency Medicine

## 2016-08-18 ENCOUNTER — Emergency Department (HOSPITAL_COMMUNITY)
Admission: EM | Admit: 2016-08-18 | Discharge: 2016-08-18 | Disposition: A | Discharge status: Home / Self Care | Payer: Federal, State, Local not specified - PPO | Attending: Emergency Medicine | Admitting: Emergency Medicine

## 2016-08-18 DIAGNOSIS — Z79899 Other long term (current) drug therapy: Secondary | ICD-10-CM | POA: Insufficient documentation

## 2016-08-18 DIAGNOSIS — L0501 Pilonidal cyst with abscess: Secondary | ICD-10-CM | POA: Insufficient documentation

## 2016-08-18 MED ORDER — IBUPROFEN 600 MG PO TABS: 600.0000 mg | ORAL_TABLET | Freq: Four times a day (QID) | ORAL | 0 refills | Status: DC | PRN

## 2016-08-18 MED ORDER — LIDOCAINE HCL (PF) 2 % IJ SOLN
10.0000 mL | Freq: Once | INTRAMUSCULAR | Status: AC
  Administered 2016-08-18: 10 mL
  Filled 2016-08-18: qty 10

## 2016-08-18 MED ORDER — CEPHALEXIN 500 MG PO CAPS: 500.0000 mg | ORAL_CAPSULE | Freq: Four times a day (QID) | ORAL | 0 refills | Status: DC

## 2016-08-18 NOTE — ED Triage Notes (Signed)
Patient c/o pilonidal cyst that started to cause pain on Monday. Per patient has had in past but resolved on it's own. Denies any drainage or fevers. Per patient has some right leg numbness when she lays down at night.

## 2016-08-18 NOTE — ED Provider Notes (Signed)
[facility] DEPT Provider Note   CSN: [id number] Arrival date & time: 08/18/16  1343     History   Chief Complaint Chief Complaint  Patient presents with  . Cyst    HPI Stephanie Acosta is a 26 y.o. female.  HPI   Stephanie Acosta is a 26 y.o. female, with a history of Pilonidal cysts, presenting to the ED with a painful area to the upper, central buttocks worsening over the last 6 days. States she has had previous similar issues, but these resolved with warm compresses. Warm compresses have not worked this time. Denies drainage, fever, changes in bowel movements, or any other complaints. Tetanus up-to-date.  No known history of MRSA infection. Patient has not had a menstrual cycle for the last 6 years due to [place]. She took a home pregnancy test yesterday and it was negative. She did this because the last time she had one of these cysts/abscesses it was during her pregnancies.    Past Medical History:  Diagnosis Date  . Anxiety   . Celiac disease sept 2015  . Depression   . Endometriosis   . GERD (gastroesophageal reflux disease)   . IBS (irritable bowel syndrome)   . PCOS (polycystic ovarian syndrome) 2013  . Pelvic pain in female   . Wears contact lenses     Patient Active Problem List   Diagnosis Date Noted  . Major depression, single episode 04/02/2016  . NSVD (normal spontaneous vaginal delivery) 04/23/2013  . Normal pregnancy 04/22/2013    Past Surgical History:  Procedure Laterality Date  . DILATION AND EVACUATION N/A 07/09/2012   Procedure: DILATATION AND EVACUATION;  Surgeon: Margarette Asal, MD;  Location: [facility];  Service: Gynecology;  Laterality: N/A;  . LAPAROSCOPY N/A 09/07/2014   Procedure: LAPAROSCOPY DIAGNOSTIC;  Surgeon: Molli Posey, MD;  Location: [facility];  Service: Gynecology;  Laterality: N/A;  . WISDOM TOOTH EXTRACTION      OB History    Gravida Para Term Preterm AB Living   3 2 2  0 1 2   SAB TAB Ectopic  Multiple Live Births   1 0 0 0 2       Home Medications    Prior to Admission medications   Medication Sig Start Date End Date Taking? Authorizing Provider  cephALEXin (KEFLEX) 500 MG capsule Take 1 capsule (500 mg total) by mouth 4 (four) times daily. 08/18/16   Joy, Shawn C, PA-C  clonazePAM (KLONOPIN) 0.5 MG tablet Take 1 tablet (0.5 mg total) by mouth daily as needed for anxiety. 04/02/16 04/02/17  Cloria Spring, MD  escitalopram (LEXAPRO) 20 MG tablet Take 1 tablet (20 mg total) by mouth daily. 05/24/16 05/24/17  Cloria Spring, MD  ibuprofen (ADVIL,MOTRIN) 600 MG tablet Take 1 tablet (600 mg total) by mouth every 6 (six) hours as needed. 08/18/16   Joy, Shawn C, PA-C  ibuprofen (ADVIL,MOTRIN) 800 MG tablet Take 1 tablet (800 mg total) by mouth every 8 (eight) hours as needed for moderate pain. 09/07/14   Molli Posey, MD    Family History Family History  Problem Relation Age of Onset  . Diabetes Maternal Grandfather   . Diabetes Paternal Grandmother   . Depression Mother   . Alcohol abuse Mother   . Bipolar disorder Maternal Aunt   . Alcohol abuse Maternal Uncle   . Alcohol abuse Maternal Uncle   . Alcohol abuse Maternal Uncle   . Alcohol abuse Maternal Uncle     Social  History Social History  Substance Use Topics  . Smoking status: Never Smoker  . Smokeless tobacco: Never Used  . Alcohol use Yes     Comment: occas glass of wine     Allergies   Sulfa antibiotics   Review of Systems Review of Systems  Constitutional: Negative for chills and fever.  Gastrointestinal: Negative for nausea and vomiting.  Skin: Positive for color change.  Neurological: Negative for weakness and numbness.     Physical Exam Updated Vital Signs BP 130/73 (BP Location: Right Arm)   Pulse 92   Temp 97.9 F (36.6 C) (Oral)   Resp 18   Ht 5' 4"  (1.626 m)   Wt 97.5 kg (215 lb)   SpO2 100%   BMI 36.90 kg/m   Physical Exam  Constitutional: She appears well-developed and  well-nourished. No distress.  HENT:  Head: Normocephalic and atraumatic.  Eyes: Conjunctivae are normal.  Neck: Neck supple.  Cardiovascular: Normal rate and regular rhythm.   Pulmonary/Chest: Effort normal.  Musculoskeletal: She exhibits tenderness.  Full range of motion in the bilateral hips through the cardinal directions.  Neurological: She is alert.  Sensory deficits noted in the lower extremities. Strength 5 out of 5 bilaterally. No gait deficit.  Skin: Skin is warm and dry. She is not diaphoretic.  2 x 2 centimeter tender, fluctuant mass in the skin of the sacral/left gluteal region. No active drainage. Small area of surrounding erythema.  Psychiatric: She has a normal mood and affect. Her behavior is normal.  Nursing note and vitals reviewed.    ED Treatments / Results  Labs (all labs ordered are listed, but only abnormal results are displayed) Labs Reviewed - No data to display  EKG  EKG Interpretation None       Radiology No results found.  Procedures .Marland KitchenIncision and Drainage Date/Time: 08/18/2016 2:50 PM Performed by: Lorayne Bender Authorized by: Arlean Hopping C   Consent:    Consent obtained:  Verbal   Consent given by:  Patient   Risks discussed:  Bleeding, incomplete drainage, infection and pain Location:    Type:  Abscess   Size:  2x2cm   Location:  Anogenital   Anogenital location:  Pilonidal Pre-procedure details:    Skin preparation:  Betadine Procedure type:    Complexity:  Complex Procedure details:    Incision types:  Cruciate   Incision depth:  Dermal   Scalpel blade:  11   Wound management:  Irrigated with saline, extensive cleaning and probed and deloculated   Drainage:  Purulent   Drainage amount:  Copious   Wound treatment:  Wound left open   Packing materials:  None Post-procedure details:    Patient tolerance of procedure:  Tolerated well, no immediate complications   (including critical care time)  EMERGENCY DEPARTMENT [place] SOFT  TISSUE INTERPRETATION "Study: Limited Soft Tissue Ultrasound"  INDICATIONS: Soft tissue infection Multiple views of the body part were obtained in real-time with a multi-frequency linear probe  PERFORMED BY: Myself IMAGES ARCHIVED?: Yes SIDE:Left BODY PART:Lower back INTERPRETATION:  Abcess present and Cellulitis present   Medications Ordered in ED Medications  lidocaine (XYLOCAINE) 2 % injection 10 mL (10 mLs Infiltration Given 08/18/16 1518)     Initial Impression / Assessment and Plan / ED Course  I have reviewed the triage vital signs and the nursing notes.  Pertinent labs & imaging results that were available during my care of the patient were reviewed by me and considered in my medical decision  making (see chart for details).      Patient presents with what appears to be a pilonidal abscess. I&D performed without immediate complication. PCP follow-up for wound check this week. The patient was given instructions for home care as well as return precautions. Patient voices understanding of these instructions, accepts the plan, and is comfortable with discharge.    Final Clinical Impressions(s) / ED Diagnoses   Final diagnoses:  Pilonidal abscess    New Prescriptions Discharge Medication List as of 08/18/2016  3:06 PM    START taking these medications   Details  cephALEXin (KEFLEX) 500 MG capsule Take 1 capsule (500 mg total) by mouth 4 (four) times daily., Starting Sun 08/18/2016, Print    !! ibuprofen (ADVIL,MOTRIN) 600 MG tablet Take 1 tablet (600 mg total) by mouth every 6 (six) hours as needed., Starting Sun 08/18/2016, Print     !! - Potential duplicate medications found. Please discuss with provider.       Lorayne Bender, PA-C 08/19/16 1004    Nat Christen, MD 08/20/16 [phone]

## 2016-08-18 NOTE — Discharge Instructions (Signed)
You may remove the bandage after 24 hours. Clean the wound and surrounding area gently with tap water and mild soap. Rinse well and blot dry.  Mix Chlorhexidine into a sitz bath daily for the next week. You may then use chlorhexidine as a body wash on a weekly basis. You may shower, but avoid submerging the wound, such as with a bath or swimming, except as previously stated. Clean the wound daily to prevent infection. Do not use cleaners such as hydrogen peroxide or alcohol.   Scar reduction: Application of a topical antibiotic ointment, such as Neosporin, after the wound has begun to close and heal well can decrease scab formation and reduce scarring. After the wound has healed, application of ointments such as Aquaphor can also reduce scar formation.  The key to scar reduction is keeping the skin well hydrated and supple. Drinking plenty of water throughout the day (At least eight 8oz glasses of water a day) is essential to staying well hydrated.  Sun exposure: Keep the wound out of the sun. After the wound has healed, continue to protect it from the sun by wearing protective clothing or applying sunscreen.  Pain: You may use Tylenol, naproxen, or ibuprofen for pain.  Please take all of your antibiotics until finished!   You may develop abdominal discomfort or diarrhea from the antibiotic.  You may help offset this with probiotics which you can buy or get in yogurt. Do not eat or take the probiotics until 2 hours after your antibiotic.   Return to the ED in 3 days for wound check.  Return to the ED sooner should signs of further infection arise, such as spreading redness, puffiness/swelling, severe increase in pain, fever over 100.19F, or any other major issues.

## 2016-08-18 NOTE — ED Notes (Signed)
Pt reports pain and swelling since last Monday-She reports 2 previous events when she had had recent childbirth  She reports that she has never had this problem as badly

## 2018-11-16 ENCOUNTER — Other Ambulatory Visit: Payer: Self-pay

## 2018-11-16 DIAGNOSIS — Z20822 Contact with and (suspected) exposure to covid-19: Secondary | ICD-10-CM

## 2018-11-17 LAB — NOVEL CORONAVIRUS, NAA: SARS-CoV-2, NAA: NOT DETECTED

## 2018-11-25 ENCOUNTER — Other Ambulatory Visit: Payer: Self-pay

## 2018-11-25 DIAGNOSIS — Z20822 Contact with and (suspected) exposure to covid-19: Secondary | ICD-10-CM

## 2018-11-26 LAB — NOVEL CORONAVIRUS, NAA: SARS-CoV-2, NAA: NOT DETECTED

## 2018-12-01 ENCOUNTER — Other Ambulatory Visit: Payer: Self-pay

## 2018-12-01 DIAGNOSIS — Z20822 Contact with and (suspected) exposure to covid-19: Secondary | ICD-10-CM

## 2018-12-02 LAB — NOVEL CORONAVIRUS, NAA: SARS-CoV-2, NAA: NOT DETECTED

## 2018-12-02 NOTE — Progress Notes (Signed)
[id number] test negative.   Patient's coronavirus test resulted to me likely on accident.  Patient is not a patient in pulmonary.  Unable to route to patient's primary care provider listed in the system.  Will route to Lazaro Arms, NP for follow-up per protocol.    Also see seeing Burman Nieves as FYI.   TN,  Can you follow up with the patient per protocol so she knows her Covid19 test is negative?  Aaron Edelman

## 2019-08-19 ENCOUNTER — Other Ambulatory Visit: Payer: Self-pay | Admitting: Gastroenterology

## 2019-08-19 DIAGNOSIS — R634 Abnormal weight loss: Secondary | ICD-10-CM

## 2019-08-19 DIAGNOSIS — R109 Unspecified abdominal pain: Secondary | ICD-10-CM

## 2019-09-03 ENCOUNTER — Ambulatory Visit
Admission: RE | Admit: 2019-09-03 | Discharge: 2019-09-03 | Disposition: A | Discharge status: Home / Self Care | Payer: Federal, State, Local not specified - PPO | Source: Ambulatory Visit | Attending: Gastroenterology | Admitting: Gastroenterology

## 2019-09-03 DIAGNOSIS — R109 Unspecified abdominal pain: Secondary | ICD-10-CM

## 2019-09-03 DIAGNOSIS — R634 Abnormal weight loss: Secondary | ICD-10-CM

## 2019-09-03 MED ORDER — IOPAMIDOL (ISOVUE-300) INJECTION 61%
100.0000 mL | Freq: Once | INTRAVENOUS | Status: AC | PRN
  Administered 2019-09-03: 100 mL via INTRAVENOUS

## 2020-10-24 ENCOUNTER — Ambulatory Visit
Admission: RE | Admit: 2020-10-24 | Discharge: 2020-10-24 | Disposition: A | Discharge status: Home / Self Care | Payer: Federal, State, Local not specified - PPO | Source: Ambulatory Visit | Attending: Family Medicine | Admitting: Family Medicine

## 2020-10-24 ENCOUNTER — Other Ambulatory Visit: Payer: Self-pay

## 2020-10-24 VITALS — BP 125/86 | HR 126 | Temp 98.5°F | Resp 19

## 2020-10-24 DIAGNOSIS — J069 Acute upper respiratory infection, unspecified: Secondary | ICD-10-CM | POA: Diagnosis not present

## 2020-10-24 MED ORDER — PROMETHAZINE-DM 6.25-15 MG/5ML PO SYRP: 5.0000 mL | ORAL_SOLUTION | Freq: Four times a day (QID) | ORAL | 0 refills | Status: DC | PRN

## 2020-10-24 NOTE — Discharge Instructions (Signed)
You have been tested for COVID-19 today. °If your test returns positive, you will receive a phone call from [facility] regarding your results. °Negative test results are not called. °Both positive and negative results area always visible on MyChart. °If you do not have a MyChart account, sign up instructions are provided in your discharge papers. °Please do not hesitate to contact us should you have questions or concerns. ° °

## 2020-10-24 NOTE — ED Triage Notes (Addendum)
Hoarseness,  fatigue, headache, runny nose, sore throat ,cough and congestion that started Sunday night.

## 2020-10-24 NOTE — ED Provider Notes (Signed)
[facility]   [id number] 10/24/20 Arrival Time: 1238  ASSESSMENT & PLAN:  1. Viral URI with cough    Discussed typical duration of viral illnesses. COVID-19 testing sent. OTC symptom care as needed. Work note provided.  Meds ordered this encounter  Medications   promethazine-dextromethorphan (PROMETHAZINE-DM) 6.25-15 MG/5ML syrup    Sig: Take 5 mLs by mouth 4 (four) times daily as needed for cough.    Dispense:  118 mL    Refill:  0     Follow-up Information     Pallone, Jarvis Newcomer, MD.   Specialty: Family Medicine Why: If worsening or failing to improve as anticipated. Contact information: [address] [place] [address] [phone]                 Reviewed expectations re: course of current medical issues. Questions answered. Outlined signs and symptoms indicating need for more acute intervention. Understanding verbalized. After Visit Summary given.   SUBJECTIVE: History from: patient. Stephanie Acosta is a 30 y.o. female who reports hoarse voice, fatigue, HA, runny nose, mild ST, HA, cough; x 4 days. Denies: fever and difficulty breathing. Normal PO intake without n/v/d.   OBJECTIVE:  Vitals:   10/24/20 1257 10/24/20 1258  BP: 125/86   Pulse:  (!) 126  Resp: 19   Temp: 98.5 F (36.9 C)   TempSrc: Oral   SpO2: 99%     General appearance: alert; no distress; appears fatigued Eyes: PERRLA; EOMI; conjunctiva normal HENT: [place]; AT; with nasal congestion Neck: supple  Lungs: speaks full sentences without difficulty; unlabored; CTAB Extremities: no edema Skin: warm and dry Neurologic: normal gait Psychological: alert and cooperative; normal mood and affect  Labs:  Labs Reviewed  NOVEL CORONAVIRUS, NAA     Allergies  Allergen Reactions   Sulfa Antibiotics Rash    Childhood reaction    Past Medical History:  Diagnosis Date   Anxiety    Celiac disease sept 2015   Depression    Endometriosis    GERD  (gastroesophageal reflux disease)    IBS (irritable bowel syndrome)    PCOS (polycystic ovarian syndrome) 2013   Pelvic pain in female    Wears contact lenses    Social History   Socioeconomic History   Marital status: Married    Spouse name: Not on file   Number of children: Not on file   Years of education: Not on file   Highest education level: Not on file  Occupational History   Not on file  Tobacco Use   Smoking status: Never   Smokeless tobacco: Never  Vaping Use   Vaping Use: Never used  Substance and Sexual Activity   Alcohol use: Yes    Comment: occas glass of wine   Drug use: No   Sexual activity: Yes    Birth control/protection: I.U.D.  Other Topics Concern   Not on file  Social History Narrative   Not on file   Social Determinants of Health   Financial Resource Strain: Not on file  Food Insecurity: Not on file  Transportation Needs: Not on file  Physical Activity: Not on file  Stress: Not on file  Social Connections: Not on file  Intimate Partner Violence: Not on file   Family History  Problem Relation Age of Onset   Diabetes Maternal Grandfather    Diabetes Paternal Grandmother    Depression Mother    Alcohol abuse Mother    Bipolar disorder Maternal Aunt    Alcohol  abuse Maternal Uncle    Alcohol abuse Maternal Uncle    Alcohol abuse Maternal Uncle    Alcohol abuse Maternal Uncle    Past Surgical History:  Procedure Laterality Date   DILATION AND EVACUATION N/A 07/09/2012   Procedure: DILATATION AND EVACUATION;  Surgeon: Margarette Asal, MD;  Location: [facility];  Service: Gynecology;  Laterality: N/A;   LAPAROSCOPY N/A 09/07/2014   Procedure: LAPAROSCOPY DIAGNOSTIC;  Surgeon: Molli Posey, MD;  Location: [facility];  Service: Gynecology;  Laterality: N/A;   WISDOM TOOTH EXTRACTION       Vanessa Kick, MD 10/24/20 1330

## 2020-10-25 LAB — SARS-COV-2, NAA 2 DAY TAT

## 2020-10-25 LAB — NOVEL CORONAVIRUS, NAA: SARS-CoV-2, NAA: NOT DETECTED

## 2020-10-31 ENCOUNTER — Encounter: Payer: Self-pay | Admitting: Emergency Medicine

## 2020-10-31 ENCOUNTER — Other Ambulatory Visit: Payer: Self-pay

## 2020-10-31 ENCOUNTER — Ambulatory Visit
Admission: EM | Admit: 2020-10-31 | Discharge: 2020-10-31 | Disposition: A | Discharge status: Home / Self Care | Payer: Federal, State, Local not specified - PPO

## 2020-10-31 DIAGNOSIS — J209 Acute bronchitis, unspecified: Secondary | ICD-10-CM | POA: Diagnosis not present

## 2020-10-31 MED ORDER — DOXYCYCLINE HYCLATE 100 MG PO CAPS: 100.0000 mg | ORAL_CAPSULE | Freq: Two times a day (BID) | ORAL | 0 refills | Status: DC

## 2020-10-31 MED ORDER — ALBUTEROL SULFATE HFA 108 (90 BASE) MCG/ACT IN AERS: 2.0000 | INHALATION_SPRAY | Freq: Four times a day (QID) | RESPIRATORY_TRACT | 2 refills | Status: DC | PRN

## 2020-10-31 MED ORDER — PREDNISONE 50 MG PO TABS: ORAL_TABLET | ORAL | 0 refills | Status: DC

## 2020-10-31 NOTE — Discharge Instructions (Addendum)
Return if any problems.

## 2020-10-31 NOTE — ED Triage Notes (Signed)
Seen 1 week ago. States she is no better states cough is worse and she is coughing up green sputum.  States she has been vomiting.  Feels fatigued.  States it feels like she has blisters in her throat

## 2020-11-02 NOTE — ED Provider Notes (Signed)
[facility]    CSN: [id number] Arrival date & time: 10/31/20  1357      History   Chief Complaint No chief complaint on file.   HPI Stephanie Acosta is a 30 y.o. female.   The history is provided by the patient. No language interpreter was used.  Cough Cough characteristics:  Productive Sputum characteristics:  Nondescript Severity:  Moderate Onset quality:  Gradual Duration:  1 week Timing:  Constant Progression:  Worsening Chronicity:  New Smoker: no   Relieved by:  Nothing Worsened by:  Nothing Ineffective treatments:  None tried Associated symptoms: sinus congestion   Associated symptoms: no shortness of breath    Past Medical History:  Diagnosis Date   Anxiety    Celiac disease sept 2015   Depression    Endometriosis    GERD (gastroesophageal reflux disease)    IBS (irritable bowel syndrome)    PCOS (polycystic ovarian syndrome) 2013   Pelvic pain in female    Wears contact lenses     Patient Active Problem List   Diagnosis Date Noted   Major depression, single episode 04/02/2016   NSVD (normal spontaneous vaginal delivery) 04/23/2013   Normal pregnancy 04/22/2013    Past Surgical History:  Procedure Laterality Date   DILATION AND EVACUATION N/A 07/09/2012   Procedure: DILATATION AND EVACUATION;  Surgeon: Margarette Asal, MD;  Location: [facility];  Service: Gynecology;  Laterality: N/A;   LAPAROSCOPY N/A 09/07/2014   Procedure: LAPAROSCOPY DIAGNOSTIC;  Surgeon: Molli Posey, MD;  Location: [facility];  Service: Gynecology;  Laterality: N/A;   WISDOM TOOTH EXTRACTION      OB History     Gravida  3   Para  2   Term  2   Preterm  0   AB  1   Living  2      SAB  1   IAB  0   Ectopic  0   Multiple  0   Live Births  2            Home Medications    Prior to Admission medications   Medication Sig Start Date End Date Taking? Authorizing Provider  albuterol (VENTOLIN HFA) 108 (90 Base) MCG/ACT  inhaler Inhale 2 puffs into the lungs every 6 (six) hours as needed for wheezing or shortness of breath. 10/31/20  Yes Caryl Ada K, PA-C  buPROPion (WELLBUTRIN XL) 300 MG 24 hr tablet Take 300 mg by mouth daily.   Yes [provider]  doxycycline (VIBRAMYCIN) 100 MG capsule Take 1 capsule (100 mg total) by mouth 2 (two) times daily. 10/31/20  Yes Fransico Meadow, PA-C  phentermine 37.5 MG capsule Take 37.5 mg by mouth every morning.   Yes [provider]  predniSONE (DELTASONE) 50 MG tablet One tablet a day 10/31/20  Yes Fransico Meadow, PA-C  propranolol (INDERAL) 20 MG tablet Take 20 mg by mouth 3 (three) times daily.   Yes [provider]  venlafaxine XR (EFFEXOR-XR) 150 MG 24 hr capsule Take 300 mg by mouth daily with breakfast.   Yes [provider]  cephALEXin (KEFLEX) 500 MG capsule Take 1 capsule (500 mg total) by mouth 4 (four) times daily. 08/18/16   Joy, Shawn C, PA-C  clonazePAM (KLONOPIN) 0.5 MG tablet Take 1 tablet (0.5 mg total) by mouth daily as needed for anxiety. 04/02/16 04/02/17  Cloria Spring, MD  escitalopram (LEXAPRO) 20 MG tablet Take 1 tablet (20 mg total)  by mouth daily. 05/24/16 05/24/17  Cloria Spring, MD  ibuprofen (ADVIL,MOTRIN) 600 MG tablet Take 1 tablet (600 mg total) by mouth every 6 (six) hours as needed. 08/18/16   Joy, Shawn C, PA-C  ibuprofen (ADVIL,MOTRIN) 800 MG tablet Take 1 tablet (800 mg total) by mouth every 8 (eight) hours as needed for moderate pain. 09/07/14   Molli Posey, MD  promethazine-dextromethorphan (PROMETHAZINE-DM) 6.25-15 MG/5ML syrup Take 5 mLs by mouth 4 (four) times daily as needed for cough. 10/24/20   Vanessa Kick, MD    Family History Family History  Problem Relation Age of Onset   Diabetes Maternal Grandfather    Diabetes Paternal Grandmother    Depression Mother    Alcohol abuse Mother    Bipolar disorder Maternal Aunt    Alcohol abuse Maternal Uncle    Alcohol abuse Maternal Uncle     Alcohol abuse Maternal Uncle    Alcohol abuse Maternal Uncle     Social History Social History   Tobacco Use   Smoking status: Never   Smokeless tobacco: Never  Vaping Use   Vaping Use: Never used  Substance Use Topics   Alcohol use: Yes    Comment: occas glass of wine   Drug use: No     Allergies   Sulfa antibiotics   Review of Systems Review of Systems  Respiratory:  Positive for cough. Negative for shortness of breath.   All other systems reviewed and are negative.   Physical Exam Triage Vital Signs ED Triage Vitals  Enc Vitals Group     BP 10/31/20 1531 114/78     Pulse Rate 10/31/20 1531 (!) 103     Resp 10/31/20 1531 18     Temp 10/31/20 1531 98.2 F (36.8 C)     Temp Source 10/31/20 1531 Oral     SpO2 10/31/20 1531 98 %     Weight --      Height --      Head Circumference --      Peak Flow --      Pain Score 10/31/20 1532 2     Pain Loc --      Pain Edu? --      Excl. in [place]? --    No data found.  Updated Vital Signs BP 114/78 (BP Location: Right Arm)   Pulse (!) 103   Temp 98.2 F (36.8 C) (Oral)   Resp 18   SpO2 98%   Visual Acuity Right Eye Distance:   Left Eye Distance:   Bilateral Distance:    Right Eye Near:   Left Eye Near:    Bilateral Near:     Physical Exam Vitals and nursing note reviewed.  Constitutional:      Appearance: She is well-developed.  HENT:     Head: Normocephalic.     Right Ear: Tympanic membrane normal.     Left Ear: Tympanic membrane normal.     Mouth/Throat:     Mouth: Mucous membranes are moist.  Cardiovascular:     Rate and Rhythm: Normal rate.  Pulmonary:     Effort: Pulmonary effort is normal.     Breath sounds: Rhonchi present.  Abdominal:     General: There is no distension.  Musculoskeletal:        General: Normal range of motion.     Cervical back: Normal range of motion.  Neurological:     Mental Status: She is alert and oriented to person, place, and time.  Psychiatric:  Mood  and Affect: Mood normal.     UC Treatments / Results  Labs (all labs ordered are listed, but only abnormal results are displayed) Labs Reviewed - No data to display  EKG   Radiology No results found.  Procedures Procedures (including critical care time)  Medications Ordered in UC Medications - No data to display  Initial Impression / Assessment and Plan / UC Course  I have reviewed the triage vital signs and the nursing notes.  Pertinent labs & imaging results that were available during my care of the patient were reviewed by me and considered in my medical decision making (see chart for details).     MDM Final Clinical Impressions(s) / UC Diagnoses   Final diagnoses:  Acute bronchitis, unspecified organism     Discharge Instructions      Return if any problems.    ED Prescriptions     Medication Sig Dispense Auth. Provider   doxycycline (VIBRAMYCIN) 100 MG capsule Take 1 capsule (100 mg total) by mouth 2 (two) times daily. 20 capsule Koralee Wedeking K, [place]   albuterol (VENTOLIN HFA) 108 (90 Base) MCG/ACT inhaler Inhale 2 puffs into the lungs every 6 (six) hours as needed for wheezing or shortness of breath. 8 g Zymier Rodgers K, PA-C   predniSONE (DELTASONE) 50 MG tablet One tablet a day 5 tablet Fransico Meadow, [place]      An After Visit Summary was printed and given to the patient.  PDMP not reviewed this encounter.   Fransico Meadow, [place] 11/02/20 1042

## 2021-10-20 IMAGING — CT CT ABD-PELV W/ CM
2 of 4 series · 16 of 46 positions shown, 18 images · IV contrast (iopamidol)
Comparison: 02/17/2014

CLINICAL DATA: 35 lb weight loss in past 7 months. Alternating
diarrhea and constipation. Endometriosis.

EXAM:
CT ABDOMEN AND PELVIS WITH CONTRAST
TECHNIQUE: Multidetector CT imaging of the abdomen and pelvis was performed
using the standard protocol following bolus administration of
intravenous contrast.
CONTRAST:  100mL K7L0TH-3PP IOPAMIDOL (K7L0TH-3PP) INJECTION 61%

[Series 2: abd pelvis 5.00 br40 s3 axial · axial · 0.77mm/px · z∈[+1096,+1546]mm · 13 of 101 slices shown, 15 images]
[im 6/101  soft-tissue]
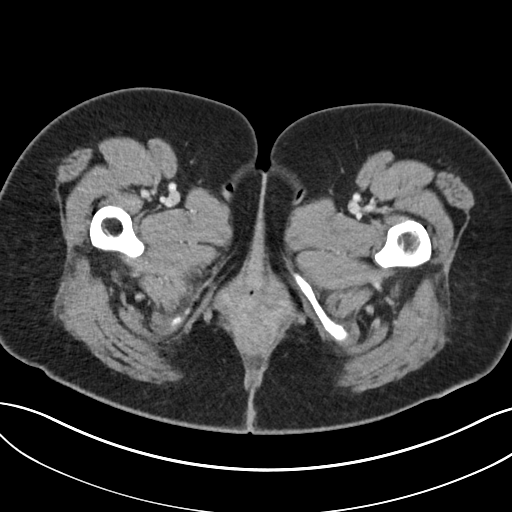
[im 6/101  bone]
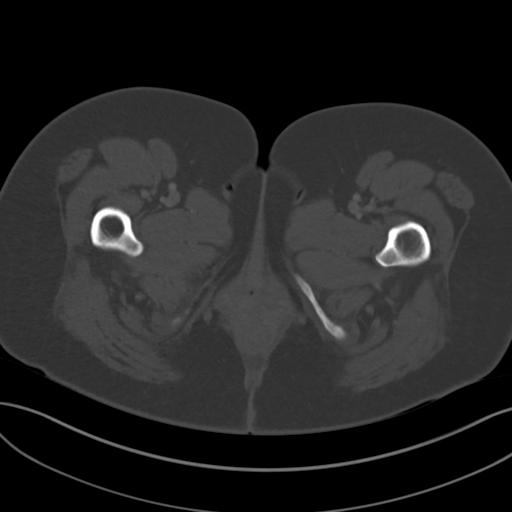
[im 16/101  soft-tissue]
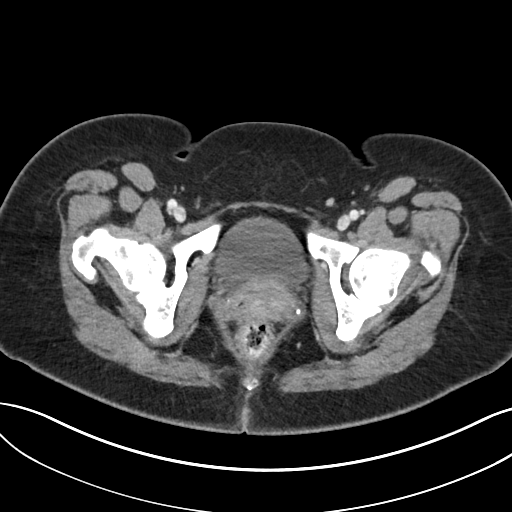
[im 21/101  soft-tissue]
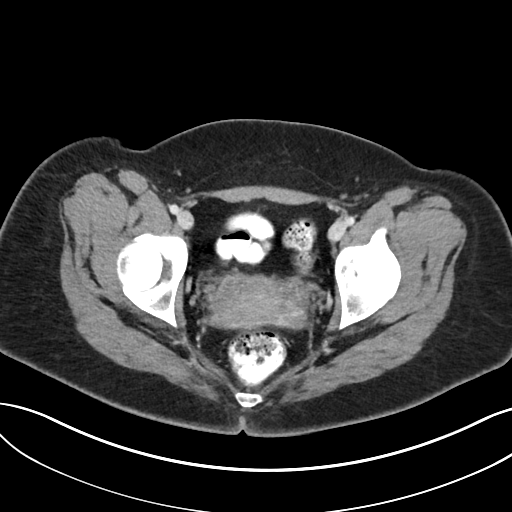
[im 31/101  soft-tissue]
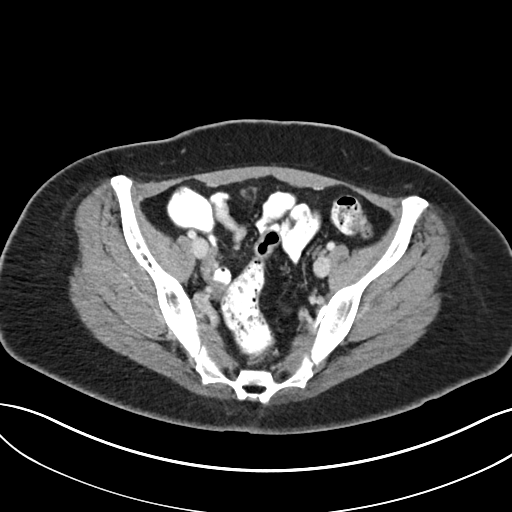
[im 36/101  soft-tissue]
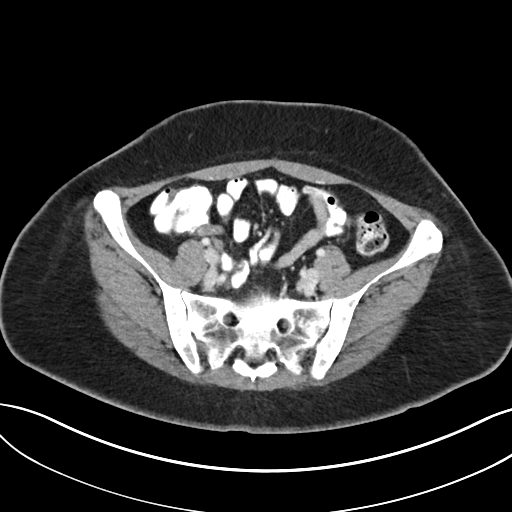
[im 46/101  soft-tissue]
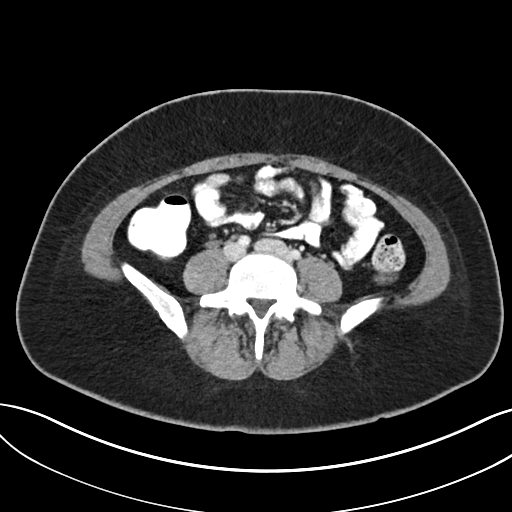
[im 51/101  soft-tissue]
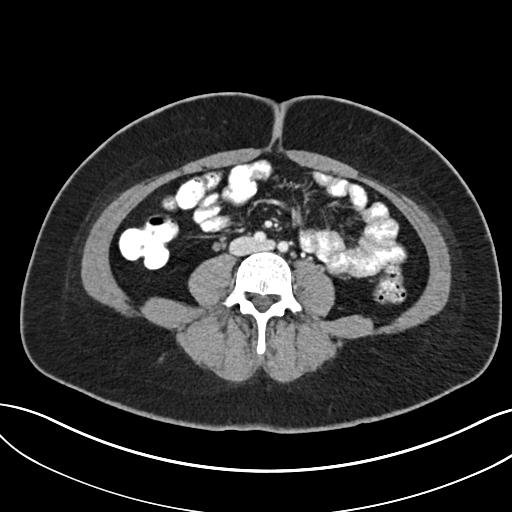
[im 56/101  soft-tissue]
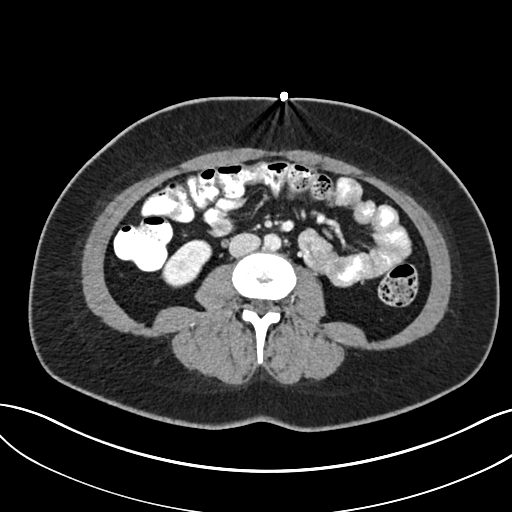
[im 66/101  soft-tissue]
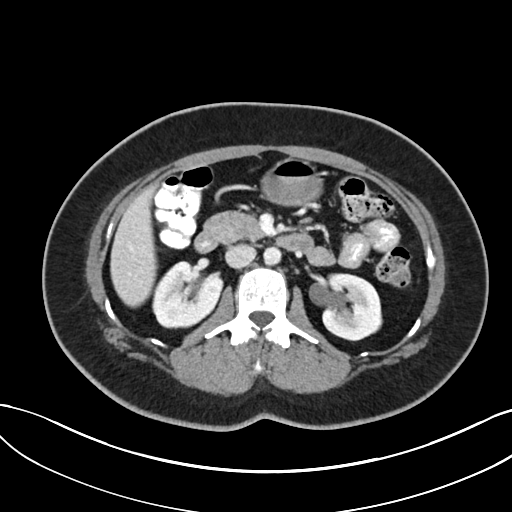
[im 66/101  bone]
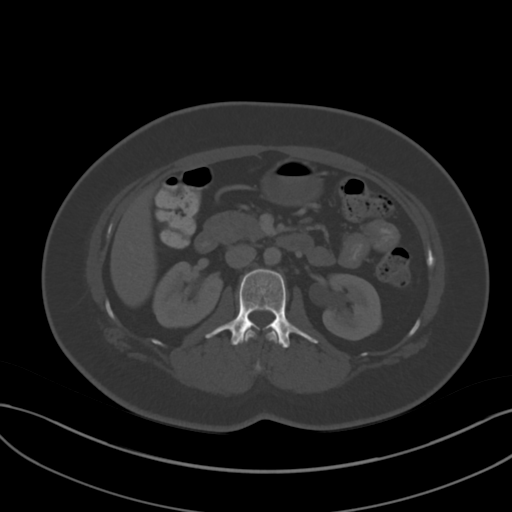
[im 71/101  soft-tissue]
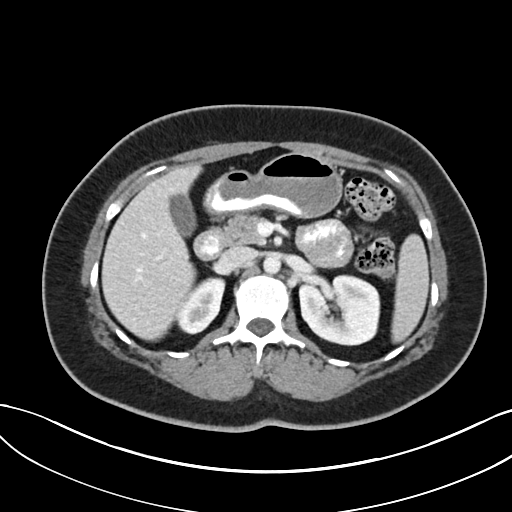
[im 81/101  soft-tissue]
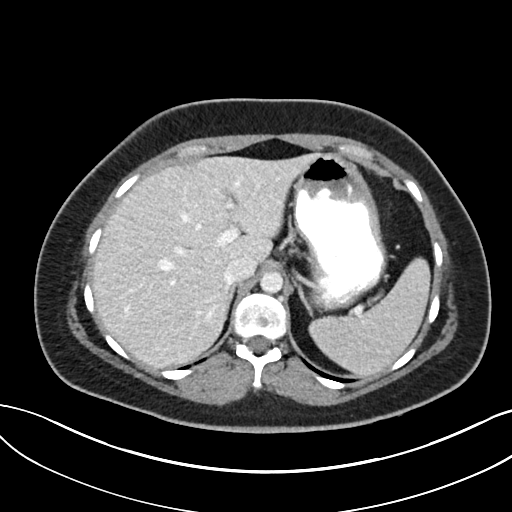
[im 86/101  soft-tissue]
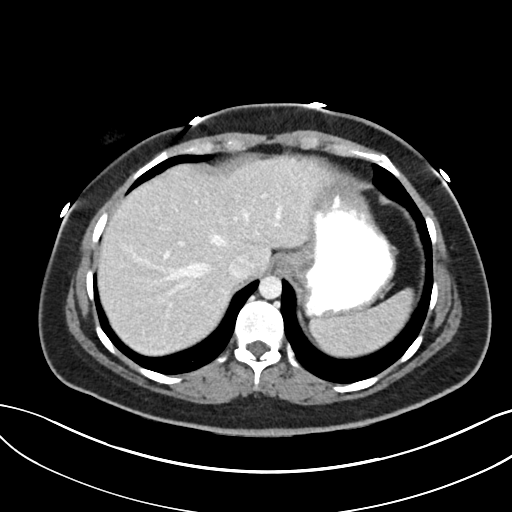
[im 96/101  soft-tissue]
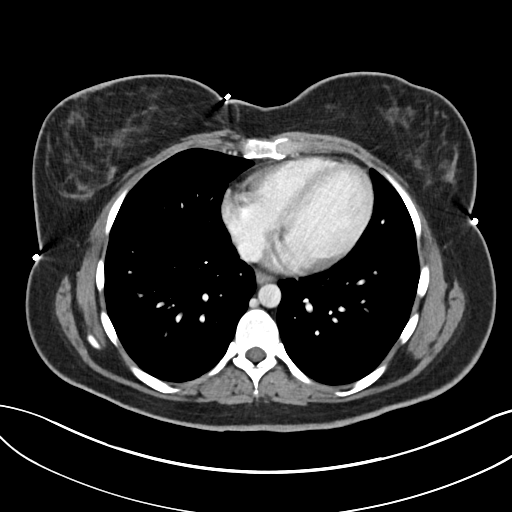

[Series 6: abd pelvis 2.00 br40 s3 cor · coronal · 0.77mm/px · 3 of 144 slices shown]
[im 48/144  soft-tissue]
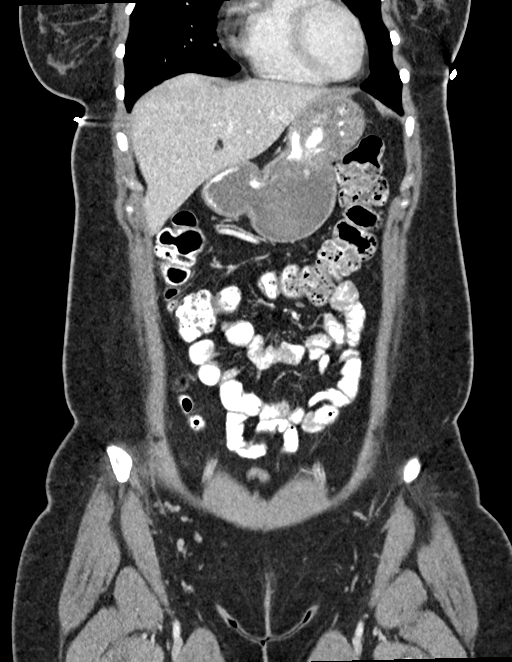
[im 64/144  soft-tissue]
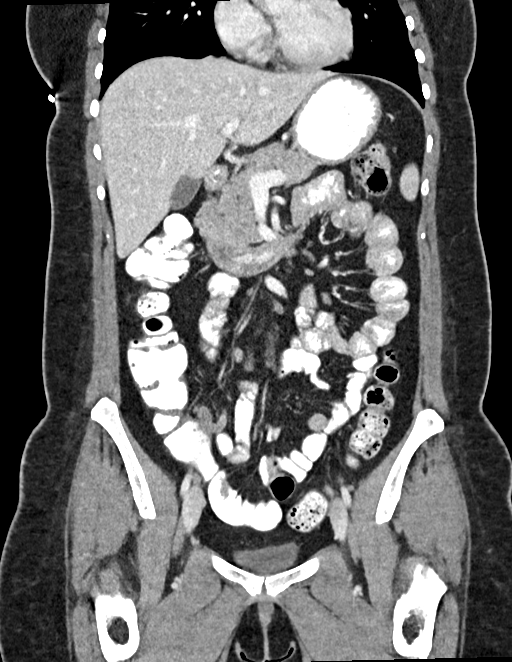
[im 80/144  soft-tissue]
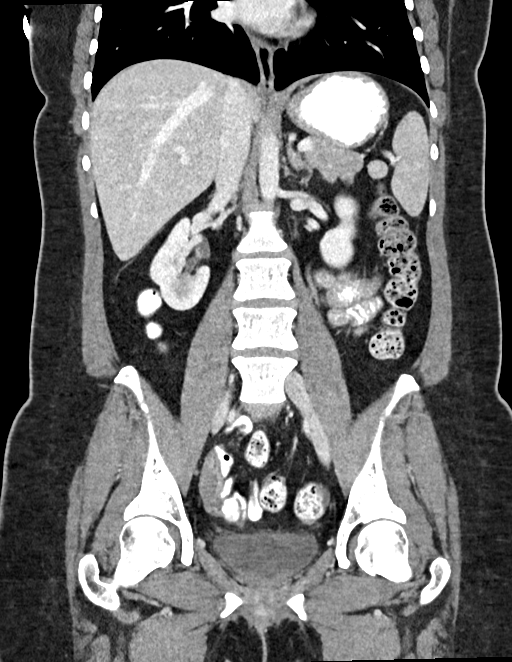

[16 of 46 positions shown; findings below may reference images not displayed]

FINDINGS: Lower Chest: No acute findings.

Hepatobiliary: No hepatic masses identified. Gallbladder is
unremarkable. No evidence of biliary ductal dilatation.

Pancreas:  No mass or inflammatory changes.

Spleen: Within normal limits in size and appearance.

Adrenals/Urinary Tract: No masses identified. No evidence of
ureteral calculi or hydronephrosis.

Stomach/Bowel: No evidence of obstruction, inflammatory process or
abnormal fluid collections. Normal appendix visualized.

Vascular/Lymphatic: No pathologically enlarged lymph nodes. No
abdominal aortic aneurysm.

Reproductive:  No mass or other significant abnormality.

Other:  None.

Musculoskeletal:  No suspicious bone lesions identified.
IMPRESSION: Negative. No evidence of neoplasm or other significant abnormality.

## 2021-11-14 ENCOUNTER — Ambulatory Visit
Admission: EM | Admit: 2021-11-14 | Discharge: 2021-11-14 | Disposition: A | Discharge status: Home / Self Care | Payer: Federal, State, Local not specified - PPO

## 2021-11-14 DIAGNOSIS — R197 Diarrhea, unspecified: Secondary | ICD-10-CM | POA: Diagnosis not present

## 2021-11-14 DIAGNOSIS — R112 Nausea with vomiting, unspecified: Secondary | ICD-10-CM

## 2021-11-14 DIAGNOSIS — R519 Headache, unspecified: Secondary | ICD-10-CM | POA: Diagnosis not present

## 2021-11-14 LAB — POCT URINALYSIS DIP (MANUAL ENTRY)
Bilirubin, UA: NEGATIVE
Glucose, UA: NEGATIVE mg/dL
Ketones, POC UA: NEGATIVE mg/dL
Nitrite, UA: NEGATIVE
Protein Ur, POC: NEGATIVE mg/dL
Spec Grav, UA: 1.025 (ref 1.010–1.025)
Urobilinogen, UA: 0.2 E.U./dL
pH, UA: 7 (ref 5.0–8.0)

## 2021-11-14 LAB — POCT URINE PREGNANCY: Preg Test, Ur: NEGATIVE

## 2021-11-14 MED ORDER — ONDANSETRON 4 MG PO TBDP: 4.0000 mg | ORAL_TABLET | Freq: Three times a day (TID) | ORAL | 0 refills | Status: DC | PRN

## 2021-11-14 MED ORDER — ONDANSETRON 8 MG PO TBDP
8.0000 mg | ORAL_TABLET | Freq: Once | ORAL | Status: AC
  Administered 2021-11-14: 8 mg via ORAL

## 2021-11-14 MED ORDER — LOPERAMIDE HCL 2 MG PO CAPS: 2.0000 mg | ORAL_CAPSULE | Freq: Four times a day (QID) | ORAL | 0 refills | Status: DC | PRN

## 2021-11-14 NOTE — ED Provider Notes (Signed)
[facility]    CSN: [id number] Arrival date & time: 11/14/21  1103      History   Chief Complaint No chief complaint on file.   HPI Stephanie Acosta is a 31 y.o. female.   Patient presenting today with 2-day history of upper abdominal pain, nausea, vomiting, diarrhea.  Not tolerating p.o. at this time.  Denies fever, chills, upper respiratory symptoms, body aches.  Tried Mylanta with no relief.  No known sick contacts, new medications or diet changes recently.    Past Medical History:  Diagnosis Date   Anxiety    Celiac disease sept 2015   Depression    Endometriosis    GERD (gastroesophageal reflux disease)    IBS (irritable bowel syndrome)    PCOS (polycystic ovarian syndrome) 2013   Pelvic pain in female    Wears contact lenses     Patient Active Problem List   Diagnosis Date Noted   Major depression, single episode 04/02/2016   NSVD (normal spontaneous vaginal delivery) 04/23/2013   Normal pregnancy 04/22/2013    Past Surgical History:  Procedure Laterality Date   DILATION AND EVACUATION N/A 07/09/2012   Procedure: DILATATION AND EVACUATION;  Surgeon: Margarette Asal, MD;  Location: [facility];  Service: Gynecology;  Laterality: N/A;   LAPAROSCOPY N/A 09/07/2014   Procedure: LAPAROSCOPY DIAGNOSTIC;  Surgeon: Molli Posey, MD;  Location: [facility];  Service: Gynecology;  Laterality: N/A;   WISDOM TOOTH EXTRACTION      OB History     Gravida  3   Para  2   Term  2   Preterm  0   AB  1   Living  2      SAB  1   IAB  0   Ectopic  0   Multiple  0   Live Births  2            Home Medications    Prior to Admission medications   Medication Sig Start Date End Date Taking? Authorizing Provider  Calcium & Magnesium Carbonates (MYLANTA PO) Take by mouth.   Yes [provider]  etonogestrel (NEXPLANON) 68 MG IMPL implant 1 each by Subdermal route once.   Yes [provider]  loperamide  (IMODIUM) 2 MG capsule Take 1 capsule (2 mg total) by mouth 4 (four) times daily as needed for diarrhea or loose stools. 11/14/21  Yes Volney American, PA-C  ondansetron (ZOFRAN-ODT) 4 MG disintegrating tablet Take 1 tablet (4 mg total) by mouth every 8 (eight) hours as needed for nausea or vomiting. 11/14/21  Yes Volney American, PA-C  topiramate (TOPAMAX) 25 MG tablet TAKE 2 TABLETS BY MOUTH EVERY MORNING AND 1 TABLET EVERY EVENING 08/31/21  Yes [provider]  albuterol (VENTOLIN HFA) 108 (90 Base) MCG/ACT inhaler Inhale 2 puffs into the lungs every 6 (six) hours as needed for wheezing or shortness of breath. 10/31/20   Fransico Meadow, PA-C  buPROPion (WELLBUTRIN XL) 300 MG 24 hr tablet Take 300 mg by mouth daily.    [provider]  cephALEXin (KEFLEX) 500 MG capsule Take 1 capsule (500 mg total) by mouth 4 (four) times daily. 08/18/16   Joy, Shawn C, PA-C  clonazePAM (KLONOPIN) 0.5 MG tablet Take 1 tablet (0.5 mg total) by mouth daily as needed for anxiety. 04/02/16 04/02/17  Cloria Spring, MD  doxycycline (VIBRAMYCIN) 100 MG capsule Take 1 capsule (100 mg total) by mouth 2 (two) times daily.  10/31/20   Fransico Meadow, PA-C  escitalopram (LEXAPRO) 20 MG tablet Take 1 tablet (20 mg total) by mouth daily. 05/24/16 05/24/17  Cloria Spring, MD  ibuprofen (ADVIL,MOTRIN) 600 MG tablet Take 1 tablet (600 mg total) by mouth every 6 (six) hours as needed. 08/18/16   Joy, Shawn C, PA-C  ibuprofen (ADVIL,MOTRIN) 800 MG tablet Take 1 tablet (800 mg total) by mouth every 8 (eight) hours as needed for moderate pain. 09/07/14   Molli Posey, MD  phentermine 37.5 MG capsule Take 37.5 mg by mouth every morning.    [provider]  predniSONE (DELTASONE) 50 MG tablet One tablet a day 10/31/20   Fransico Meadow, PA-C  promethazine-dextromethorphan (PROMETHAZINE-DM) 6.25-15 MG/5ML syrup Take 5 mLs by mouth 4 (four) times daily as needed for cough. 10/24/20   Vanessa Kick, MD   propranolol (INDERAL) 20 MG tablet Take 20 mg by mouth 3 (three) times daily.    [provider]  venlafaxine XR (EFFEXOR-XR) 150 MG 24 hr capsule Take 300 mg by mouth daily with breakfast.    [provider]    Family History Family History  Problem Relation Age of Onset   Diabetes Maternal Grandfather    Diabetes Paternal Grandmother    Depression Mother    Alcohol abuse Mother    Bipolar disorder Maternal Aunt    Alcohol abuse Maternal Uncle    Alcohol abuse Maternal Uncle    Alcohol abuse Maternal Uncle    Alcohol abuse Maternal Uncle     Social History Social History   Tobacco Use   Smoking status: Never   Smokeless tobacco: Never  Vaping Use   Vaping Use: Never used  Substance Use Topics   Alcohol use: Yes    Comment: occas glass of wine   Drug use: No     Allergies   Tuberculin ppd; Tuberculin, ppd; and Sulfa antibiotics   Review of Systems Review of Systems Per HPI  Physical Exam Triage Vital Signs ED Triage Vitals  Enc Vitals Group     BP 11/14/21 1132 (!) 146/84     Pulse Rate 11/14/21 1132 80     Resp 11/14/21 1132 18     Temp 11/14/21 1132 98 F (36.7 C)     Temp Source 11/14/21 1132 Oral     SpO2 11/14/21 1132 98 %     Weight --      Height --      Head Circumference --      Peak Flow --      Pain Score 11/14/21 1110 10     Pain Loc --      Pain Edu? --      Excl. in [place]? --    No data found.  Updated Vital Signs BP (!) 146/84 (BP Location: Right Arm)   Pulse 80   Temp 98 F (36.7 C) (Oral)   Resp 18   SpO2 98%   Visual Acuity Right Eye Distance:   Left Eye Distance:   Bilateral Distance:    Right Eye Near:   Left Eye Near:    Bilateral Near:     Physical Exam Vitals and nursing note reviewed.  Constitutional:      Appearance: Normal appearance. She is not ill-appearing.  HENT:     Head: Atraumatic.     Mouth/Throat:     Mouth: Mucous membranes are moist.  Eyes:     Extraocular Movements:  Extraocular movements intact.  Conjunctiva/sclera: Conjunctivae normal.  Cardiovascular:     Rate and Rhythm: Normal rate and regular rhythm.     Heart sounds: Normal heart sounds.  Pulmonary:     Effort: Pulmonary effort is normal.     Breath sounds: Normal breath sounds.  Abdominal:     General: Bowel sounds are normal. There is no distension.     Palpations: Abdomen is soft.     Tenderness: There is abdominal tenderness. There is no right CVA tenderness, left CVA tenderness or guarding.     Comments: Mild epigastric tenderness to palpation without distention or guarding  Musculoskeletal:        General: Normal range of motion.     Cervical back: Normal range of motion and neck supple.  Skin:    General: Skin is warm and dry.  Neurological:     Mental Status: She is alert and oriented to person, place, and time.     Motor: No weakness.     Gait: Gait normal.  Psychiatric:        Mood and Affect: Mood normal.        Thought Content: Thought content normal.        Judgment: Judgment normal.    UC Treatments / Results  Labs (all labs ordered are listed, but only abnormal results are displayed) Labs Reviewed  POCT URINALYSIS DIP (MANUAL ENTRY) - Abnormal; Notable for the following components:      Result Value   Blood, UA trace-intact (*)    Leukocytes, UA Small (1+) (*)    All other components within normal limits  POCT URINE PREGNANCY    EKG   Radiology No results found.  Procedures Procedures (including critical care time)  Medications Ordered in UC Medications  ondansetron (ZOFRAN-ODT) disintegrating tablet 8 mg (8 mg Oral Given 11/14/21 1152)    Initial Impression / Assessment and Plan / UC Course  I have reviewed the triage vital signs and the nursing notes.  Pertinent labs & imaging results that were available during my care of the patient were reviewed by me and considered in my medical decision making (see chart for details).     Suspect viral GI  illness causing symptoms, vitals and exam overall reassuring, urinalysis without evidence of a urinary tract infection, urine pregnancy negative.  Zofran given in clinic for active nausea and vomiting, will treat with Zofran, Imodium, brat diet, fluids.  Work note given.  Return for any worsening symptoms.  Final Clinical Impressions(s) / UC Diagnoses   Final diagnoses:  Nausea vomiting and diarrhea  Acute nonintractable headache, unspecified headache type   Discharge Instructions   None    ED Prescriptions     Medication Sig Dispense Auth. Provider   ondansetron (ZOFRAN-ODT) 4 MG disintegrating tablet Take 1 tablet (4 mg total) by mouth every 8 (eight) hours as needed for nausea or vomiting. 20 tablet Volney American, [place]   loperamide (IMODIUM) 2 MG capsule Take 1 capsule (2 mg total) by mouth 4 (four) times daily as needed for diarrhea or loose stools. 12 capsule Volney American, [place]      PDMP not reviewed this encounter.   Volney American, [place] 11/14/21 1235

## 2021-11-14 NOTE — ED Triage Notes (Signed)
Pt reports abdominal pain, diarrhea, headache, nausea, vomiting x 2 days. Mylanta gives no relief.

## 2021-11-18 ENCOUNTER — Emergency Department (HOSPITAL_COMMUNITY)
Admission: EM | Admit: 2021-11-18 | Discharge: 2021-11-18 | Disposition: A | Discharge status: Home / Self Care | Payer: Federal, State, Local not specified - PPO | Attending: Emergency Medicine | Admitting: Emergency Medicine

## 2021-11-18 ENCOUNTER — Emergency Department (HOSPITAL_COMMUNITY): Payer: Federal, State, Local not specified - PPO

## 2021-11-18 ENCOUNTER — Other Ambulatory Visit: Payer: Self-pay

## 2021-11-18 ENCOUNTER — Encounter (HOSPITAL_COMMUNITY): Payer: Self-pay | Admitting: Emergency Medicine

## 2021-11-18 DIAGNOSIS — Z79899 Other long term (current) drug therapy: Secondary | ICD-10-CM | POA: Insufficient documentation

## 2021-11-18 DIAGNOSIS — R1013 Epigastric pain: Secondary | ICD-10-CM | POA: Insufficient documentation

## 2021-11-18 DIAGNOSIS — R112 Nausea with vomiting, unspecified: Secondary | ICD-10-CM | POA: Insufficient documentation

## 2021-11-18 DIAGNOSIS — R197 Diarrhea, unspecified: Secondary | ICD-10-CM | POA: Insufficient documentation

## 2021-11-18 LAB — COMPREHENSIVE METABOLIC PANEL
ALT: 31 U/L (ref 0–44)
AST: 31 U/L (ref 15–41)
Albumin: 4.1 g/dL (ref 3.5–5.0)
Alkaline Phosphatase: 57 U/L (ref 38–126)
Anion gap: 7 (ref 5–15)
BUN: 10 mg/dL (ref 6–20)
CO2: 23 mmol/L (ref 22–32)
Calcium: 9.2 mg/dL (ref 8.9–10.3)
Chloride: 105 mmol/L (ref 98–111)
Creatinine, Ser: 0.94 mg/dL (ref 0.44–1.00)
GFR, Estimated: >60 mL/min (ref >60)
Glucose, Bld: 114 mg/dL — ABNORMAL HIGH (ref 70–99)
Potassium: 3.5 mmol/L (ref 3.5–5.1)
Sodium: 135 mmol/L (ref 135–145)
Total Bilirubin: 0.6 mg/dL (ref 0.3–1.2)
Total Protein: 7.9 g/dL (ref 6.5–8.1)

## 2021-11-18 LAB — URINALYSIS, ROUTINE W REFLEX MICROSCOPIC
Bacteria, UA: NONE SEEN
Bilirubin Urine: NEGATIVE
Glucose, UA: NEGATIVE mg/dL
Ketones, ur: NEGATIVE mg/dL
Leukocytes,Ua: NEGATIVE
Nitrite: NEGATIVE
Protein, ur: NEGATIVE mg/dL
Specific Gravity, Urine: >1.046 — ABNORMAL HIGH (ref 1.005–1.030)
pH: 5 (ref 5.0–8.0)

## 2021-11-18 LAB — CBC
HCT: 34.7 % — ABNORMAL LOW (ref 36.0–46.0)
Hemoglobin: 10.2 g/dL — ABNORMAL LOW (ref 12.0–15.0)
MCH: 22.7 pg — ABNORMAL LOW (ref 26.0–34.0)
MCHC: 29.4 g/dL — ABNORMAL LOW (ref 30.0–36.0)
MCV: 77.1 fL — ABNORMAL LOW (ref 80.0–100.0)
Platelets: 309 10*3/uL (ref 150–400)
RBC: 4.5 MIL/uL (ref 3.87–5.11)
RDW: 15.9 % — ABNORMAL HIGH (ref 11.5–15.5)
WBC: 7.1 10*3/uL (ref 4.0–10.5)
nRBC: 0 % (ref 0.0–0.2)

## 2021-11-18 LAB — LIPASE, BLOOD: Lipase: 28 U/L (ref 11–51)

## 2021-11-18 LAB — POC URINE PREG, ED: Preg Test, Ur: NEGATIVE

## 2021-11-18 MED ORDER — HYDROMORPHONE HCL 1 MG/ML IJ SOLN
1.0000 mg | Freq: Once | INTRAMUSCULAR | Status: AC
  Administered 2021-11-18: 1 mg via INTRAVENOUS
  Filled 2021-11-18: qty 1

## 2021-11-18 MED ORDER — ONDANSETRON HCL 4 MG/2ML IJ SOLN
4.0000 mg | Freq: Once | INTRAMUSCULAR | Status: AC
  Administered 2021-11-18: 4 mg via INTRAVENOUS
  Filled 2021-11-18: qty 2

## 2021-11-18 MED ORDER — KETOROLAC TROMETHAMINE 15 MG/ML IJ SOLN
15.0000 mg | Freq: Once | INTRAMUSCULAR | Status: AC
  Administered 2021-11-18: 15 mg via INTRAVENOUS
  Filled 2021-11-18: qty 1

## 2021-11-18 MED ORDER — IOHEXOL 300 MG/ML  SOLN
100.0000 mL | Freq: Once | INTRAMUSCULAR | Status: AC | PRN
  Administered 2021-11-18: 100 mL via INTRAVENOUS

## 2021-11-18 MED ORDER — SODIUM CHLORIDE 0.9 % IV BOLUS
1000.0000 mL | Freq: Once | INTRAVENOUS | Status: AC
  Administered 2021-11-18: 1000 mL via INTRAVENOUS

## 2021-11-18 MED ORDER — HYDROMORPHONE HCL 1 MG/ML IJ SOLN
0.5000 mg | Freq: Once | INTRAMUSCULAR | Status: AC
  Administered 2021-11-18: 0.5 mg via INTRAVENOUS
  Filled 2021-11-18: qty 0.5

## 2021-11-18 NOTE — ED Provider Notes (Signed)
[facility] EMERGENCY DEPARTMENT Provider Note   CSN: [id number] Arrival date & time: 11/18/21  1659     History  Chief Complaint  Patient presents with   Abdominal Pain    Stephanie Acosta is a 31 y.o. female.  The history is provided by the patient. No language interpreter was used.  Abdominal Pain Pain location:  Epigastric Pain quality: aching and pressure   Pain radiates to:  Does not radiate Pain severity:  Severe Onset quality:  Gradual Duration:  6 days Timing:  Constant Progression:  Worsening Chronicity:  New Context: not alcohol use, not medication withdrawal and not sick contacts   Relieved by:  Nothing Worsened by:  Nothing Ineffective treatments:  None tried Associated symptoms: anorexia, diarrhea, nausea and vomiting        Home Medications Prior to Admission medications   Medication Sig Start Date End Date Taking? Authorizing Provider  albuterol (VENTOLIN HFA) 108 (90 Base) MCG/ACT inhaler Inhale 2 puffs into the lungs every 6 (six) hours as needed for wheezing or shortness of breath. 10/31/20   Fransico Meadow, PA-C  buPROPion (WELLBUTRIN XL) 300 MG 24 hr tablet Take 300 mg by mouth daily.    [provider]  Calcium & Magnesium Carbonates (MYLANTA PO) Take by mouth.    [provider]  cephALEXin (KEFLEX) 500 MG capsule Take 1 capsule (500 mg total) by mouth 4 (four) times daily. 08/18/16   Joy, Shawn C, PA-C  clonazePAM (KLONOPIN) 0.5 MG tablet Take 1 tablet (0.5 mg total) by mouth daily as needed for anxiety. 04/02/16 04/02/17  Cloria Spring, MD  doxycycline (VIBRAMYCIN) 100 MG capsule Take 1 capsule (100 mg total) by mouth 2 (two) times daily. 10/31/20   Fransico Meadow, PA-C  escitalopram (LEXAPRO) 20 MG tablet Take 1 tablet (20 mg total) by mouth daily. 05/24/16 05/24/17  Cloria Spring, MD  etonogestrel (NEXPLANON) 68 MG IMPL implant 1 each by Subdermal route once.    [provider]  ibuprofen (ADVIL,MOTRIN) 600 MG  tablet Take 1 tablet (600 mg total) by mouth every 6 (six) hours as needed. 08/18/16   Joy, Shawn C, PA-C  ibuprofen (ADVIL,MOTRIN) 800 MG tablet Take 1 tablet (800 mg total) by mouth every 8 (eight) hours as needed for moderate pain. 09/07/14   Molli Posey, MD  loperamide (IMODIUM) 2 MG capsule Take 1 capsule (2 mg total) by mouth 4 (four) times daily as needed for diarrhea or loose stools. 11/14/21   Volney American, PA-C  ondansetron (ZOFRAN-ODT) 4 MG disintegrating tablet Take 1 tablet (4 mg total) by mouth every 8 (eight) hours as needed for nausea or vomiting. 11/14/21   Volney American, PA-C  phentermine 37.5 MG capsule Take 37.5 mg by mouth every morning.    [provider]  predniSONE (DELTASONE) 50 MG tablet One tablet a day 10/31/20   Fransico Meadow, PA-C  promethazine-dextromethorphan (PROMETHAZINE-DM) 6.25-15 MG/5ML syrup Take 5 mLs by mouth 4 (four) times daily as needed for cough. 10/24/20   Vanessa Kick, MD  propranolol (INDERAL) 20 MG tablet Take 20 mg by mouth 3 (three) times daily.    [provider]  topiramate (TOPAMAX) 25 MG tablet TAKE 2 TABLETS BY MOUTH EVERY MORNING AND 1 TABLET EVERY EVENING 08/31/21   [provider]  venlafaxine XR (EFFEXOR-XR) 150 MG 24 hr capsule Take 300 mg by mouth daily with breakfast.    [provider]      Allergies  Tuberculin ppd; Tuberculin, ppd; and Sulfa antibiotics    Review of Systems   Review of Systems  Gastrointestinal:  Positive for abdominal pain, anorexia, diarrhea, nausea and vomiting.  All other systems reviewed and are negative.   Physical Exam Updated Vital Signs BP 123/82   Pulse 92   Temp 97.8 F (36.6 C) (Oral)   Resp 18   SpO2 100%  Physical Exam Vitals and nursing note reviewed.  Constitutional:      Appearance: She is well-developed.  HENT:     Head: Normocephalic.  Cardiovascular:     Rate and Rhythm: Normal rate.  Pulmonary:     Effort: Pulmonary  effort is normal.  Abdominal:     General: Abdomen is flat. Bowel sounds are normal. There is no distension.     Palpations: Abdomen is soft.     Tenderness: There is abdominal tenderness in the epigastric area.  Musculoskeletal:        General: Normal range of motion.     Cervical back: Normal range of motion.  Skin:    General: Skin is warm.  Neurological:     General: No focal deficit present.     Mental Status: She is alert and oriented to person, place, and time.  Psychiatric:        Mood and Affect: Mood normal.     ED Results / Procedures / Treatments   Labs (all labs ordered are listed, but only abnormal results are displayed) Labs Reviewed  COMPREHENSIVE METABOLIC PANEL - Abnormal; Notable for the following components:      Result Value   Glucose, Bld 114 (*)    All other components within normal limits  CBC - Abnormal; Notable for the following components:   Hemoglobin 10.2 (*)    HCT 34.7 (*)    MCV 77.1 (*)    MCH 22.7 (*)    MCHC 29.4 (*)    RDW 15.9 (*)    All other components within normal limits  URINALYSIS, ROUTINE W REFLEX MICROSCOPIC - Abnormal; Notable for the following components:   Color, Urine STRAW (*)    Specific Gravity, Urine >1.046 (*)    Hgb urine dipstick MODERATE (*)    All other components within normal limits  LIPASE, BLOOD  POC URINE PREG, ED    EKG None  Radiology CT ABDOMEN PELVIS W CONTRAST  Result Date: 11/18/2021 CLINICAL DATA:  Acute abdominal pain for several days, initial encounter EXAM: CT ABDOMEN AND PELVIS WITH CONTRAST TECHNIQUE: Multidetector CT imaging of the abdomen and pelvis was performed using the standard protocol following bolus administration of intravenous contrast. RADIATION DOSE REDUCTION: This exam was performed according to the departmental dose-optimization program which includes automated exposure control, adjustment of the mA and/or kV according to patient size and/or use of iterative reconstruction  technique. CONTRAST:  124m OMNIPAQUE IOHEXOL 300 MG/ML  SOLN COMPARISON:  09/03/2019 FINDINGS: Lower chest: No acute abnormality. Hepatobiliary: No focal liver abnormality is seen. No gallstones, gallbladder wall thickening, or biliary dilatation. Pancreas: Unremarkable. No pancreatic ductal dilatation or surrounding inflammatory changes. Spleen: Normal in size without focal abnormality. Adrenals/Urinary Tract: Adrenal glands are within normal limits. Kidneys demonstrate a normal enhancement pattern bilaterally. Mildly prominent extrarenal pelves are seen without evidence of obstructive change. No calculi are noted. The bladder is well distended. Stomach/Bowel: The appendix is not well visualized. No inflammatory changes to suggest appendicitis are noted. No obstructive or inflammatory changes of the colon are seen. Small bowel and stomach are unremarkable.  Vascular/Lymphatic: No significant vascular findings are present. No enlarged abdominal or pelvic lymph nodes. Reproductive: Uterus and bilateral adnexa are unremarkable. Other: No abdominal wall hernia or abnormality. No abdominopelvic ascites. Musculoskeletal: No acute or significant osseous findings. IMPRESSION: No acute abnormality noted. No significant change from the prior study. Electronically Signed   By: Inez Catalina M.D.   On: 11/18/2021 20:00    Procedures Procedures    Medications Ordered in ED Medications  sodium chloride 0.9 % bolus 1,000 mL (1,000 mLs Intravenous Bolus 11/18/21 1833)  HYDROmorphone (DILAUDID) injection 1 mg (1 mg Intravenous Given 11/18/21 1834)  ondansetron (ZOFRAN) injection 4 mg (4 mg Intravenous Given 11/18/21 1834)  iohexol (OMNIPAQUE) 300 MG/ML solution 100 mL (100 mLs Intravenous Contrast Given 11/18/21 1946)    ED Course/ Medical Decision Making/ A&P                           Medical Decision Making Pt complains of abdominal pain.  Pt reports nausea, vmiting and diarrhea   Amount and/or Complexity of  Data Reviewed Independent Historian: spouse    Details: Pt here with husband who is supportive  External Data Reviewed: notes.    Details: Gyn and urgent care notes reviewed Labs: ordered. Decision-making details documented in ED Course.    Details: Pt has a normal wbc count,  ua is normal  Hemoglobin 10.2   labs ordered reviewed and interpreted  Radiology: ordered and independent interpretation performed. Decision-making details documented in ED Course.    Details: Ct abdomen and pelvis  show no acute abnormality   Risk Prescription drug management. Parenteral controlled substances. Risk Details: Pt given Iv fluids, dilaudid and zofran.  Pt reports some relief.  Pt reports comtinued nausea            Final Clinical Impression(s) / ED Diagnoses Final diagnoses:  Nausea vomiting and diarrhea    Rx / DC Orders ED Discharge Orders     None      .avs   Sidney Ace 11/18/21 2135    Noemi Chapel, MD 11/20/21 [phone]

## 2021-11-18 NOTE — ED Notes (Signed)
ED Provider at bedside. 

## 2021-11-18 NOTE — Discharge Instructions (Addendum)
Return if any problems.  See your Physician for recheck.  Gi doctor if persistent symptoms

## 2021-11-18 NOTE — ED Triage Notes (Signed)
Pt reports abdominal pain, nausea, and vomiting since Monday. Pt reports going to UC and was prescribed Zofran an imodium with no relief of the abdominal pain. Pt reports the pain is epigastric and radiates to her back.

## 2021-11-18 NOTE — ED Notes (Signed)
Patient transported to CT 

## 2021-11-19 NOTE — H&P (View-Only) (Signed)
Gastroenterology Office Note    Referring Provider: [facility] Primary Care Physician:  Ollen Bowl, MD  Primary GI: Dr. Abbey Chatters   Chief Complaint   Chief Complaint  Patient presents with   New Patient (Initial Visit)    Follow up from ER visit (pt had a ED visit and Urgent Care visit the 11th and the 15th). Pt is still in pain     History of Present Illness   Stephanie Acosta is a 31 y.o. female presenting today at the request of [facility] due to abdominal pain. She has a reported history of celiac disease, diagnosed by [facility] over 5 years ago. She attempted to be seen by [facility] but was able to be seen here sooner.   In the ED: CT without acute abnormalities on 10/15. LFTs normal. CBC with microcytic anemia, Hgb 10.2. lipase normal. Notes chronic history of anemia. Unable to tolerate iron orally due to N/V. Pregnancy screen negative.    Acute onset of pain about a week ago on Monday. Last Monday developed abdominal pain. Epigastric and LUQ. Also feels in back. Up into left shoulder blade. Ate [place] food and vomited. Was trying to flush herself out with eating [place]. Abdominal pain worsened. Called out of work Tuesday. Was taking Mylanta, Gatorade, still vomiting. Wednesday out of work and went to Urgent Care. Prescribed Zofran without relief. Was finally able to take small sips of water. Thursday started having diarrhea and had incontinent episode. Took Imodium. Friday went back to work. No oral intake as was terrified. Ate cake on Saturday with significant pain. Went to the ED. No diarrhea now.   Mother has Crohn's disease. Patient has had chronic abdominal issues. Blood test came back positive for celiac disease in the past and she reports an EGD around 2015.  Went gluten-free for a year but kept getting sick. Returned to eating gluten years ago and had done well. History of IBS as well.   Pain is about a 5 out of 10 now. Intermittent GERD. Occasional  NSAIDs but usually tylenol.   Baseline bowel habits once every 3 days. Chronic.   Hasn't had a period in about 7 years but started period on Sunday. Has Nexplanon.     Past Medical History:  Diagnosis Date   Anxiety    Celiac disease sept 2015   Depression    Endometriosis    GERD (gastroesophageal reflux disease)    IBS (irritable bowel syndrome)    PCOS (polycystic ovarian syndrome) 2013   Pelvic pain in female    Wears contact lenses     Past Surgical History:  Procedure Laterality Date   DILATION AND EVACUATION N/A 07/09/2012   Procedure: DILATATION AND EVACUATION;  Surgeon: Margarette Asal, MD;  Location: [facility];  Service: Gynecology;  Laterality: N/A;   LAPAROSCOPY N/A 09/07/2014   Procedure: LAPAROSCOPY DIAGNOSTIC;  Surgeon: Molli Posey, MD;  Location: [facility];  Service: Gynecology;  Laterality: N/A;   WISDOM TOOTH EXTRACTION      Current Outpatient Medications  Medication Sig Dispense Refill   buPROPion (WELLBUTRIN XL) 300 MG 24 hr tablet Take 300 mg by mouth daily.     etonogestrel (NEXPLANON) 68 MG IMPL implant 1 each by Subdermal route once. In Left Arm, Placed in 2021     pantoprazole (PROTONIX) 40 MG tablet Take 1 tablet (40 mg total) by mouth daily. 30 minutes before eating 30 tablet 5  promethazine (PHENERGAN) 12.5 MG tablet Take 1 tablet (12.5 mg total) by mouth every 8 (eight) hours as needed for nausea or vomiting. May take 2 tablets if needed every 8 hours. 30 tablet 0   propranolol (INDERAL) 20 MG tablet Take 20 mg by mouth 2 (two) times daily.     Rimegepant Sulfate (NURTEC) 75 MG TBDP Take 75 mg by mouth daily as needed (migraines).     sucralfate (CARAFATE) 1 g tablet Take 1 tablet (1 g total) by mouth 4 (four) times daily -  before meals and at bedtime. 120 tablet 1   tizanidine (ZANAFLEX) 2 MG capsule Take 2 mg by mouth 3 (three) times daily as needed for muscle spasms.     topiramate (TOPAMAX) 25 MG tablet Take 25-50 mg by mouth  See admin instructions. 50 mg in the morning, 25 mg in the evening     venlafaxine XR (EFFEXOR-XR) 150 MG 24 hr capsule Take 300 mg by mouth daily with breakfast.     dexmethylphenidate (FOCALIN XR) 15 MG 24 hr capsule Take 15 mg by mouth every morning.     hydrOXYzine (ATARAX) 25 MG tablet Take 25 mg by mouth 3 (three) times daily as needed for anxiety.     No current facility-administered medications for this visit.    Allergies as of 11/20/2021 - Review Complete 11/20/2021  Allergen Reaction Noted   Tuberculin ppd Hives and Swelling 09/27/2020   Sulfa antibiotics Rash 10/26/2010    Family History  Problem Relation Age of Onset   Depression Mother    Alcohol abuse Mother    Crohn's disease Mother    Colon polyps Father    Diabetes Maternal Grandfather    Diabetes Paternal Grandmother    Bipolar disorder Maternal Aunt    Alcohol abuse Maternal Uncle    Alcohol abuse Maternal Uncle    Alcohol abuse Maternal Uncle    Alcohol abuse Maternal Uncle    Colon cancer Neg Hx        great grandfather had colon cancer but no first degree relatives    Social History   Socioeconomic History   Marital status: Married    Spouse name: Not on file   Number of children: Not on file   Years of education: Not on file   Highest education level: Not on file  Occupational History   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[place]  Tobacco Use   Smoking status: Never   Smokeless tobacco: Never  Vaping Use   Vaping Use: Never used  Substance and Sexual Activity   Alcohol use: Not Currently    Comment: none since 2022   Drug use: No   Sexual activity: Yes    Birth control/protection: Implant  Other Topics Concern   Not on file  Social History Narrative   Oct 2023: 2 kids, ages 70 year old girl and 39-year-old boy   Social Determinants of Radio broadcast assistant Strain: Not on file  Food Insecurity: Not on file  Transportation Needs: Not on file  Physical Activity: Not on file   Stress: Not on file  Social Connections: Not on file  Intimate Partner Violence: Not on file     Review of Systems   Gen: Denies any fever, chills, fatigue, weight loss, lack of appetite.  CV: Denies chest pain, heart palpitations, peripheral edema, syncope.  Resp: Denies shortness of breath at rest or with exertion. Denies wheezing or cough.  GI: see HPI GU : Denies urinary burning, urinary  frequency, urinary hesitancy MS: Denies joint pain, muscle weakness, cramps, or limitation of movement.  Derm: Denies rash, itching, dry skin Psych: Denies depression, anxiety, memory loss, and confusion Heme: Denies bruising, bleeding, and enlarged lymph nodes.   Physical Exam   BP 134/84   Pulse 89   Temp 98.3 F (36.8 C)   Ht 5' 4"  (1.626 m)   Wt 203 lb 12.8 oz (92.4 kg)   LMP 11/18/2021   BMI 34.98 kg/m  General:   Alert and oriented. Pleasant and cooperative. Well-nourished and well-developed.  Head:  Normocephalic and atraumatic. Eyes:  Without icterus Ears:  Normal auditory acuity. Lungs:  Clear to auscultation bilaterally.  Heart:  S1, S2 present without murmurs appreciated.  Abdomen:  +BS, soft, TTP LUQ and epigastric and non-distended. No HSM noted. No guarding or rebound. No masses appreciated.  Rectal:  Deferred  Msk:  Symmetrical without gross deformities. Normal posture. Extremities:  Without edema. Neurologic:  Alert and  oriented x4;  grossly normal neurologically. Skin:  Intact without significant lesions or rashes. Psych:  Alert and cooperative. Normal mood and affect.  Lab Results  Component Value Date   WBC 7.1 11/18/2021   HGB 10.2 (L) 11/18/2021   HCT 34.7 (L) 11/18/2021   MCV 77.1 (L) 11/18/2021   PLT 309 11/18/2021   Lab Results  Component Value Date   ALT 31 11/18/2021   AST 31 11/18/2021   ALKPHOS 57 11/18/2021   BILITOT 0.6 11/18/2021   Lab Results  Component Value Date   LIPASE 28 11/18/2021     Assessment   Stephanie Acosta is a 31  y.o. female presenting today at the request of [facility] due to abdominal pain. She has a notable history of celiac disease that she reports was diagnosed around 67 at [place]; she is here today as could not get a sooner appointment with [facility]. No endoscopy reports or labs from that time available at time of visit.   Now with acute epigastric/LUQ pain radiating into back for a week, worsening postprandially, associated nausea and vomiting. CT imaging normal, LFTs unrevealing, lipase normal. Known chronic anemia. In setting of known celiac disease and consistently ingesting gluten, suspect this is related to uncontrolled celiac disease. Less likely PUD or biliary. However, she would like to have an ultrasound  as she states family is worried about biliary process. We will arrange EGD in near future.  Anemia: in setting of known IDA. Profoundly low ferritin last year in Care Everywhere. Unable to tolerate oral iron. IDA secondary to celiac disease. Will update labs now. Anticipate IV iron needed.   As of note, patient also reports following gluten-free diet when initially diagnosed without improvement for a year. We discussed strict avoidance going forward; doubt she is a non-responder but will need to monitor closely.    PLAN   Proceed with upper endoscopy by Dr. Abbey Chatters in near future: the risks, benefits, and alternatives have been discussed with the patient in detail. The patient states understanding and desires to proceed.   Start PPI daily, carafate sent to pharmacy, Phenergan for nausea as Zofran not helpful  Labs today  [place] in future per patient request  Follow-up thereafter    Annitta Needs, PhD, ANP-BC [facility]enter[facility]

## 2021-11-19 NOTE — Progress Notes (Unsigned)
Gastroenterology Office Note    Referring Provider: [facility] Primary Care Physician:  Ollen Bowl, MD  Primary GI: Dr. Abbey Chatters   Chief Complaint   Chief Complaint  Patient presents with   New Patient (Initial Visit)    Follow up from ER visit (pt had a ED visit and Urgent Care visit the 11th and the 15th). Pt is still in pain     History of Present Illness   Stephanie Acosta is a 31 y.o. female presenting today at the request of [facility] due to abdominal pain. She has a reported history of celiac disease, diagnosed by [facility] over 5 years ago. She attempted to be seen by [facility] but was able to be seen here sooner.   In the ED: CT without acute abnormalities on 10/15. LFTs normal. CBC with microcytic anemia, Hgb 10.2. lipase normal. Notes chronic history of anemia. Unable to tolerate iron orally due to N/V. Pregnancy screen negative.    Acute onset of pain about a week ago on Monday. Last Monday developed abdominal pain. Epigastric and LUQ. Also feels in back. Up into left shoulder blade. Ate [place] food and vomited. Was trying to flush herself out with eating [place]. Abdominal pain worsened. Called out of work Tuesday. Was taking Mylanta, Gatorade, still vomiting. Wednesday out of work and went to Urgent Care. Prescribed Zofran without relief. Was finally able to take small sips of water. Thursday started having diarrhea and had incontinent episode. Took Imodium. Friday went back to work. No oral intake as was terrified. Ate cake on Saturday with significant pain. Went to the ED. No diarrhea now.   Mother has Crohn's disease. Patient has had chronic abdominal issues. Blood test came back positive for celiac disease in the past and she reports an EGD around 2015.  Went gluten-free for a year but kept getting sick. Returned to eating gluten years ago and had done well. History of IBS as well.   Pain is about a 5 out of 10 now. Intermittent GERD. Occasional  NSAIDs but usually tylenol.   Baseline bowel habits once every 3 days. Chronic.   Hasn't had a period in about 7 years but started period on Sunday. Has Nexplanon.     Past Medical History:  Diagnosis Date   Anxiety    Celiac disease sept 2015   Depression    Endometriosis    GERD (gastroesophageal reflux disease)    IBS (irritable bowel syndrome)    PCOS (polycystic ovarian syndrome) 2013   Pelvic pain in female    Wears contact lenses     Past Surgical History:  Procedure Laterality Date   DILATION AND EVACUATION N/A 07/09/2012   Procedure: DILATATION AND EVACUATION;  Surgeon: Margarette Asal, MD;  Location: [facility];  Service: Gynecology;  Laterality: N/A;   LAPAROSCOPY N/A 09/07/2014   Procedure: LAPAROSCOPY DIAGNOSTIC;  Surgeon: Molli Posey, MD;  Location: [facility];  Service: Gynecology;  Laterality: N/A;   WISDOM TOOTH EXTRACTION      Current Outpatient Medications  Medication Sig Dispense Refill   buPROPion (WELLBUTRIN XL) 300 MG 24 hr tablet Take 300 mg by mouth daily.     etonogestrel (NEXPLANON) 68 MG IMPL implant 1 each by Subdermal route once. In Left Arm, Placed in 2021     pantoprazole (PROTONIX) 40 MG tablet Take 1 tablet (40 mg total) by mouth daily. 30 minutes before eating 30 tablet 5  promethazine (PHENERGAN) 12.5 MG tablet Take 1 tablet (12.5 mg total) by mouth every 8 (eight) hours as needed for nausea or vomiting. May take 2 tablets if needed every 8 hours. 30 tablet 0   propranolol (INDERAL) 20 MG tablet Take 20 mg by mouth 2 (two) times daily.     Rimegepant Sulfate (NURTEC) 75 MG TBDP Take 75 mg by mouth daily as needed (migraines).     sucralfate (CARAFATE) 1 g tablet Take 1 tablet (1 g total) by mouth 4 (four) times daily -  before meals and at bedtime. 120 tablet 1   tizanidine (ZANAFLEX) 2 MG capsule Take 2 mg by mouth 3 (three) times daily as needed for muscle spasms.     topiramate (TOPAMAX) 25 MG tablet Take 25-50 mg by mouth  See admin instructions. 50 mg in the morning, 25 mg in the evening     venlafaxine XR (EFFEXOR-XR) 150 MG 24 hr capsule Take 300 mg by mouth daily with breakfast.     dexmethylphenidate (FOCALIN XR) 15 MG 24 hr capsule Take 15 mg by mouth every morning.     hydrOXYzine (ATARAX) 25 MG tablet Take 25 mg by mouth 3 (three) times daily as needed for anxiety.     No current facility-administered medications for this visit.    Allergies as of 11/20/2021 - Review Complete 11/20/2021  Allergen Reaction Noted   Tuberculin ppd Hives and Swelling 09/27/2020   Sulfa antibiotics Rash 10/26/2010    Family History  Problem Relation Age of Onset   Depression Mother    Alcohol abuse Mother    Crohn's disease Mother    Colon polyps Father    Diabetes Maternal Grandfather    Diabetes Paternal Grandmother    Bipolar disorder Maternal Aunt    Alcohol abuse Maternal Uncle    Alcohol abuse Maternal Uncle    Alcohol abuse Maternal Uncle    Alcohol abuse Maternal Uncle    Colon cancer Neg Hx        great grandfather had colon cancer but no first degree relatives    Social History   Socioeconomic History   Marital status: Married    Spouse name: Not on file   Number of children: Not on file   Years of education: Not on file   Highest education level: Not on file  Occupational History   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[place]  Tobacco Use   Smoking status: Never   Smokeless tobacco: Never  Vaping Use   Vaping Use: Never used  Substance and Sexual Activity   Alcohol use: Not Currently    Comment: none since 2022   Drug use: No   Sexual activity: Yes    Birth control/protection: Implant  Other Topics Concern   Not on file  Social History Narrative   Oct 2023: 2 kids, ages 59 year old girl and 66-year-old boy   Social Determinants of Radio broadcast assistant Strain: Not on file  Food Insecurity: Not on file  Transportation Needs: Not on file  Physical Activity: Not on file   Stress: Not on file  Social Connections: Not on file  Intimate Partner Violence: Not on file     Review of Systems   Gen: Denies any fever, chills, fatigue, weight loss, lack of appetite.  CV: Denies chest pain, heart palpitations, peripheral edema, syncope.  Resp: Denies shortness of breath at rest or with exertion. Denies wheezing or cough.  GI: see HPI GU : Denies urinary burning, urinary  frequency, urinary hesitancy MS: Denies joint pain, muscle weakness, cramps, or limitation of movement.  Derm: Denies rash, itching, dry skin Psych: Denies depression, anxiety, memory loss, and confusion Heme: Denies bruising, bleeding, and enlarged lymph nodes.   Physical Exam   BP 134/84   Pulse 89   Temp 98.3 F (36.8 C)   Ht 5' 4"  (1.626 m)   Wt 203 lb 12.8 oz (92.4 kg)   LMP 11/18/2021   BMI 34.98 kg/m  General:   Alert and oriented. Pleasant and cooperative. Well-nourished and well-developed.  Head:  Normocephalic and atraumatic. Eyes:  Without icterus Ears:  Normal auditory acuity. Lungs:  Clear to auscultation bilaterally.  Heart:  S1, S2 present without murmurs appreciated.  Abdomen:  +BS, soft, TTP LUQ and epigastric and non-distended. No HSM noted. No guarding or rebound. No masses appreciated.  Rectal:  Deferred  Msk:  Symmetrical without gross deformities. Normal posture. Extremities:  Without edema. Neurologic:  Alert and  oriented x4;  grossly normal neurologically. Skin:  Intact without significant lesions or rashes. Psych:  Alert and cooperative. Normal mood and affect.  Lab Results  Component Value Date   WBC 7.1 11/18/2021   HGB 10.2 (L) 11/18/2021   HCT 34.7 (L) 11/18/2021   MCV 77.1 (L) 11/18/2021   PLT 309 11/18/2021   Lab Results  Component Value Date   ALT 31 11/18/2021   AST 31 11/18/2021   ALKPHOS 57 11/18/2021   BILITOT 0.6 11/18/2021   Lab Results  Component Value Date   LIPASE 28 11/18/2021     Assessment   Mekaylah H Barfuss is a 32  y.o. female presenting today at the request of [facility] due to abdominal pain. She has a notable history of celiac disease that she reports was diagnosed around 55 at [place]; she is here today as could not get a sooner appointment with [facility]. No endoscopy reports or labs from that time available at time of visit.   Now with acute epigastric/LUQ pain radiating into back for a week, worsening postprandially, associated nausea and vomiting. CT imaging normal, LFTs unrevealing, lipase normal. Known chronic anemia. In setting of known celiac disease and consistently ingesting gluten, suspect this is related to uncontrolled celiac disease. Less likely PUD or biliary. However, she would like to have an ultrasound  as she states family is worried about biliary process. We will arrange EGD in near future.  Anemia: in setting of known IDA. Profoundly low ferritin last year in Care Everywhere. Unable to tolerate oral iron. IDA secondary to celiac disease. Will update labs now. Anticipate IV iron needed.   As of note, patient also reports following gluten-free diet when initially diagnosed without improvement for a year. We discussed strict avoidance going forward; doubt she is a non-responder but will need to monitor closely.    PLAN   Proceed with upper endoscopy by Dr. Abbey Chatters in near future: the risks, benefits, and alternatives have been discussed with the patient in detail. The patient states understanding and desires to proceed.   Start PPI daily, carafate sent to pharmacy, Phenergan for nausea as Zofran not helpful  Labs today  [place] in future per patient request  Follow-up thereafter    Annitta Needs, PhD, ANP-BC [facility] Gastroenterology

## 2021-11-20 ENCOUNTER — Ambulatory Visit: Payer: Federal, State, Local not specified - PPO | Admitting: Gastroenterology

## 2021-11-20 ENCOUNTER — Encounter: Payer: Self-pay | Admitting: *Deleted

## 2021-11-20 ENCOUNTER — Other Ambulatory Visit (HOSPITAL_COMMUNITY): Payer: Federal, State, Local not specified - PPO

## 2021-11-20 ENCOUNTER — Encounter: Payer: Self-pay | Admitting: Gastroenterology

## 2021-11-20 ENCOUNTER — Other Ambulatory Visit: Payer: Self-pay | Admitting: Gastroenterology

## 2021-11-20 VITALS — BP 134/84 | HR 89 | Temp 98.3°F | Ht 64.0 in | Wt 203.8 lb

## 2021-11-20 DIAGNOSIS — R109 Unspecified abdominal pain: Secondary | ICD-10-CM | POA: Insufficient documentation

## 2021-11-20 DIAGNOSIS — D509 Iron deficiency anemia, unspecified: Secondary | ICD-10-CM | POA: Diagnosis not present

## 2021-11-20 DIAGNOSIS — K900 Celiac disease: Secondary | ICD-10-CM

## 2021-11-20 MED ORDER — PANTOPRAZOLE SODIUM 40 MG PO TBEC: 40.0000 mg | DELAYED_RELEASE_TABLET | Freq: Every day | ORAL | 5 refills | Status: DC

## 2021-11-20 MED ORDER — SUCRALFATE 1 G PO TABS: 1.0000 g | ORAL_TABLET | Freq: Three times a day (TID) | ORAL | 1 refills | Status: DC

## 2021-11-20 MED ORDER — PROMETHAZINE HCL 12.5 MG PO TABS: 12.5000 mg | ORAL_TABLET | Freq: Three times a day (TID) | ORAL | 0 refills | Status: DC | PRN

## 2021-11-20 NOTE — Patient Instructions (Signed)
Please have blood work done today.  We are arranging an ultrasound in the near future.   I am trying to get an upper endoscopy as soon as possible  I have sent in phenergan to take for nausea as needed. You may take 1-2 tablets for nausea, but avoid driving as this is sedating.   I have sent in carafate tablets to take before meals and at bedtime. Crush this up and mix with water. I have also sent in pantoprazole to take 30 minutes before breakfast.  Please go to the emergency room if you are unable to tolerate liquids.  It was a pleasure to see you today. I want to create trusting relationships with patients to provide genuine, compassionate, and quality care. I value your feedback. If you receive a survey regarding your visit,  I greatly appreciate you taking time to fill this out.   Annitta Needs, PhD, ANP-BC [facility] Gastroenterology

## 2021-11-21 ENCOUNTER — Other Ambulatory Visit: Payer: Self-pay

## 2021-11-21 ENCOUNTER — Encounter (HOSPITAL_COMMUNITY)
Admission: RE | Disposition: A | Discharge status: Home / Self Care | Payer: Self-pay | Source: Home / Self Care | Attending: Internal Medicine

## 2021-11-21 ENCOUNTER — Encounter (HOSPITAL_COMMUNITY): Payer: Self-pay

## 2021-11-21 ENCOUNTER — Ambulatory Visit (HOSPITAL_COMMUNITY)
Admission: RE | Admit: 2021-11-21 | Discharge: 2021-11-21 | Disposition: A | Discharge status: Home / Self Care | Payer: Federal, State, Local not specified - PPO | Attending: Internal Medicine | Admitting: Internal Medicine

## 2021-11-21 ENCOUNTER — Ambulatory Visit (HOSPITAL_COMMUNITY): Payer: Federal, State, Local not specified - PPO | Admitting: Anesthesiology

## 2021-11-21 DIAGNOSIS — R1012 Left upper quadrant pain: Secondary | ICD-10-CM | POA: Diagnosis not present

## 2021-11-21 DIAGNOSIS — F418 Other specified anxiety disorders: Secondary | ICD-10-CM | POA: Insufficient documentation

## 2021-11-21 DIAGNOSIS — K219 Gastro-esophageal reflux disease without esophagitis: Secondary | ICD-10-CM | POA: Insufficient documentation

## 2021-11-21 DIAGNOSIS — R1013 Epigastric pain: Secondary | ICD-10-CM

## 2021-11-21 DIAGNOSIS — K900 Celiac disease: Secondary | ICD-10-CM | POA: Insufficient documentation

## 2021-11-21 DIAGNOSIS — Z8379 Family history of other diseases of the digestive system: Secondary | ICD-10-CM | POA: Diagnosis not present

## 2021-11-21 HISTORY — PX: BIOPSY: SHX5522

## 2021-11-21 HISTORY — PX: ESOPHAGOGASTRODUODENOSCOPY (EGD) WITH PROPOFOL: SHX5813

## 2021-11-21 LAB — VITAMIN D 25 HYDROXY (VIT D DEFICIENCY, FRACTURES): Vit D, 25-Hydroxy: 31 ng/mL (ref 30–100)

## 2021-11-21 LAB — IRON,TIBC AND FERRITIN PANEL
%SAT: 7 % (calc) — ABNORMAL LOW (ref 16–45)
Ferritin: 5 ng/mL — ABNORMAL LOW (ref 16–154)
Iron: 37 ug/dL — ABNORMAL LOW (ref 40–190)
TIBC: 527 mcg/dL (calc) — ABNORMAL HIGH (ref 250–450)

## 2021-11-21 LAB — PREGNANCY, URINE: Preg Test, Ur: NEGATIVE

## 2021-11-21 LAB — TISSUE TRANSGLUTAMINASE, IGA: (tTG) Ab, IgA: <1 U/mL

## 2021-11-21 SURGERY — ESOPHAGOGASTRODUODENOSCOPY (EGD) WITH PROPOFOL
Anesthesia: General

## 2021-11-21 MED ORDER — LACTATED RINGERS IV SOLN: INTRAVENOUS | Status: DC

## 2021-11-21 MED ORDER — LIDOCAINE HCL (CARDIAC) PF 100 MG/5ML IV SOSY
PREFILLED_SYRINGE | INTRAVENOUS | Status: DC | PRN
  Administered 2021-11-21: 50 mg via INTRATRACHEAL

## 2021-11-21 MED ORDER — PROPOFOL 500 MG/50ML IV EMUL
INTRAVENOUS | Status: DC | PRN
  Administered 2021-11-21 (×2): 50 mg via INTRAVENOUS

## 2021-11-21 NOTE — Anesthesia Preprocedure Evaluation (Signed)
Anesthesia Evaluation  Patient identified by MRN, date of birth, ID band Patient awake    Reviewed: Allergy & Precautions, H&P , NPO status , Patient's Chart, lab work & pertinent test results, reviewed documented beta blocker date and time   Airway Mallampati: II  TM Distance: >3 FB Neck ROM: full    Dental no notable dental hx.    Pulmonary neg pulmonary ROS,    Pulmonary exam normal breath sounds clear to auscultation       Cardiovascular Exercise Tolerance: Good negative cardio ROS   Rhythm:regular Rate:Normal     Neuro/Psych PSYCHIATRIC DISORDERS Anxiety Depression negative neurological ROS     GI/Hepatic Neg liver ROS, GERD  Medicated,  Endo/Other  negative endocrine ROS  Renal/GU negative Renal ROS  negative genitourinary   Musculoskeletal   Abdominal   Peds  Hematology  (+) Blood dyscrasia, anemia ,   Anesthesia Other Findings   Reproductive/Obstetrics negative OB ROS                             Anesthesia Physical Anesthesia Plan  ASA: 2  Anesthesia Plan: General   Post-op Pain Management:    Induction:   PONV Risk Score and Plan: Propofol infusion  Airway Management Planned:   Additional Equipment:   Intra-op Plan:   Post-operative Plan:   Informed Consent: I have reviewed the patients History and Physical, chart, labs and discussed the procedure including the risks, benefits and alternatives for the proposed anesthesia with the patient or authorized representative who has indicated his/her understanding and acceptance.     Dental Advisory Given  Plan Discussed with: CRNA  Anesthesia Plan Comments:         Anesthesia Quick Evaluation

## 2021-11-21 NOTE — Discharge Instructions (Addendum)
EGD Discharge instructions Please read the instructions outlined below and refer to this sheet in the next few weeks. These discharge instructions provide you with general information on caring for yourself after you leave the hospital. Your doctor may also give you specific instructions. While your treatment has been planned according to the most current medical practices available, unavoidable complications occasionally occur. If you have any problems or questions after discharge, please call your doctor. ACTIVITY You may resume your regular activity but move at a slower pace for the next 24 hours.  Take frequent rest periods for the next 24 hours.  Walking will help expel (get rid of) the air and reduce the bloated feeling in your abdomen.  No driving for 24 hours (because of the anesthesia (medicine) used during the test).  You may shower.  Do not sign any important legal documents or operate any machinery for 24 hours (because of the anesthesia used during the test).  NUTRITION Drink plenty of fluids.  You may resume your normal diet.  Begin with a light meal and progress to your normal diet.  Avoid alcoholic beverages for 24 hours or as instructed by your caregiver.  MEDICATIONS You may resume your normal medications unless your caregiver tells you otherwise.  WHAT YOU CAN EXPECT TODAY You may experience abdominal discomfort such as a feeling of fullness or "gas" pains.  FOLLOW-UP Your doctor will discuss the results of your test with you.  SEEK IMMEDIATE MEDICAL ATTENTION IF ANY OF THE FOLLOWING OCCUR: Excessive nausea (feeling sick to your stomach) and/or vomiting.  Severe abdominal pain and distention (swelling).  Trouble swallowing.  Temperature over 101 F (37.8 C).  Rectal bleeding or vomiting of blood.    Your esophagus and stomach both appeared normal.  Your small bowel appeared normal although I took numerous biopsies given your history of celiac disease.  You did have  active bile leaking into your small bowel from your common bile duct.  Proceed with ultrasound to evaluate gallbladder.  We may need to consider HIDA scan if this is unremarkable.  Continue pantoprazole and Carafate.  Follow-up with Vicente Males in 4 to 6 weeks.  I hope you have a great rest of your week!  Elon Alas. Abbey Chatters, D.O. Gastroenterology and Hepatology [facility]

## 2021-11-21 NOTE — Op Note (Signed)
[facility] Patient Name: Stephanie Acosta Procedure Date: 11/21/2021 12:22 PM MRN: [id number] Date of Birth: 08-10-1990 Attending MD: Elon Alas. Abbey Chatters DO CSN: [id number] Age: 31 Admit Type: Outpatient Procedure:                Upper GI endoscopy Indications:              Epigastric abdominal pain, Abdominal pain in the                            left upper quadrant Providers:                Elon Alas. Abbey Chatters, DO, Rosina Lowenstein, RN, Ladoris Gene Technician, Technician Referring MD:              Medicines:                See the Anesthesia note for documentation of the                            administered medications Complications:            No immediate complications. Estimated Blood Loss:     Estimated blood loss was minimal. Procedure:                Pre-Anesthesia Assessment:                           - The anesthesia plan was to use monitored                            anesthesia care (MAC).                           After obtaining informed consent, the endoscope was                            passed under direct vision. Throughout the                            procedure, the patient's blood pressure, pulse, and                            oxygen saturations were monitored continuously. The                            [id number]-H190 ([id number]) scope was introduced through the                            mouth, and advanced to the third part of duodenum.                            The upper GI endoscopy was accomplished without                            difficulty. The patient  tolerated the procedure                            well. Scope In: 12:29:35 PM Scope Out: 12:32:02 PM Total Procedure Duration: 0 hours 2 minutes 27 seconds  Findings:      The Z-line was regular and was found 36 cm from the incisors.      There is no endoscopic evidence of bleeding, areas of erosion,       esophagitis, ulcerations or varices in the entire esophagus.       The entire examined stomach was normal.      The duodenal bulb, first portion of the duodenum, second portion of the       duodenum and third portion of the duodenum were normal. Biopsies for       histology were taken with a cold forceps for evaluation of celiac       disease. Bile actively leaking into duodenum. No reflux of bile into       stomach. Impression:               - Z-line regular, 36 cm from the incisors.                           - Normal stomach.                           - Normal duodenal bulb, first portion of the                            duodenum, second portion of the duodenum and third                            portion of the duodenum. Biopsied. Moderate Sedation:      Per Anesthesia Care Recommendation:           - Patient has a contact number available for                            emergencies. The signs and symptoms of potential                            delayed complications were discussed with the                            patient. Return to normal activities tomorrow.                            Written discharge instructions were provided to the                            patient.                           - Resume previous diet.                           - Continue present medications.                           -  Await pathology results.                           - Consider further workup for biliary colic                            including possible HIDA scan.                           - Consider cholestyramine if having diarrhea. Procedure Code(s):        --- Professional ---                           [phone], Esophagogastroduodenoscopy, flexible,                            transoral; with biopsy, single or multiple Diagnosis Code(s):        --- Professional ---                           R10.13, Epigastric pain                           R10.12, Left upper quadrant pain CPT copyright 2019 [employer] Association. All rights reserved. The codes  documented in this report are preliminary and upon coder review may  be revised to meet current compliance requirements. Elon Alas. Abbey Chatters, DO [place] Abbey Chatters, DO 11/21/2021 12:37:44 PM This report has been signed electronically. Number of Addenda: 0

## 2021-11-21 NOTE — Transfer of Care (Signed)
Immediate Anesthesia Transfer of Care Note  Patient: Rosealee Albee  Procedure(s) Performed: ESOPHAGOGASTRODUODENOSCOPY (EGD) WITH PROPOFOL BIOPSY  Patient Location: PACU  Anesthesia Type:General  Level of Consciousness: awake, alert , oriented and patient cooperative  Airway & Oxygen Therapy: Patient Spontanous Breathing  Post-op Assessment: Report given to RN, Post -op Vital signs reviewed and stable and Patient moving all extremities X 4  Post vital signs: Reviewed and stable  Last Vitals:  Vitals Value Taken Time  BP 101/60 11/21/21 1238  Temp 36.4 C 11/21/21 1238  Pulse 83 11/21/21 1238  Resp 18 11/21/21 1238  SpO2 98 % 11/21/21 1238    Last Pain:  Vitals:   11/21/21 1238  TempSrc: Axillary  PainSc: 0-No pain      Patients Stated Pain Goal: 3 (37/29/02 1115)  Complications: No notable events documented.

## 2021-11-21 NOTE — Interval H&P Note (Signed)
History and Physical Interval Note:  11/21/2021 11:46 AM  Stephanie Acosta  has presented today for surgery, with the diagnosis of celiac disease.  The various methods of treatment have been discussed with the patient and family. After consideration of risks, benefits and other options for treatment, the patient has consented to  Procedure(s) with comments: ESOPHAGOGASTRODUODENOSCOPY (EGD) WITH PROPOFOL (N/A) - 12:45pm, asa 2 as a surgical intervention.  The patient's history has been reviewed, patient examined, no change in status, stable for surgery.  I have reviewed the patient's chart and labs.  Questions were answered to the patient's satisfaction.     [name]

## 2021-11-22 LAB — SURGICAL PATHOLOGY

## 2021-11-22 NOTE — Telephone Encounter (Signed)
[place] is scheduled for tomorrow 11/23/21 at 9:30 am. Pt has been informed.

## 2021-11-22 NOTE — Anesthesia Postprocedure Evaluation (Signed)
Anesthesia Post Note  Patient: Stephanie Acosta  Procedure(s) Performed: ESOPHAGOGASTRODUODENOSCOPY (EGD) WITH PROPOFOL BIOPSY  Patient location during evaluation: Phase II Anesthesia Type: General Level of consciousness: awake Pain management: pain level controlled Vital Signs Assessment: post-procedure vital signs reviewed and stable Respiratory status: spontaneous breathing and respiratory function stable Cardiovascular status: blood pressure returned to baseline and stable Postop Assessment: no headache and no apparent nausea or vomiting Anesthetic complications: no Comments: Late entry   No notable events documented.   Last Vitals:  Vitals:   11/21/21 1112 11/21/21 1238  BP: 128/74 101/60  Pulse: 84 83  Resp: 19 18  Temp: 36.8 C (!) 36.4 C  SpO2: 96% 98%    Last Pain:  Vitals:   11/21/21 1238  TempSrc: Axillary  PainSc: 0-No pain                 [name]

## 2021-11-22 NOTE — Telephone Encounter (Signed)
Mindy/Tammy:  Is it possible to get the [place] ASAP? Tomorrow?

## 2021-11-23 ENCOUNTER — Inpatient Hospital Stay (HOSPITAL_COMMUNITY): Admission: RE | Admit: 2021-11-23 | Payer: Federal, State, Local not specified - PPO | Source: Ambulatory Visit

## 2021-11-23 ENCOUNTER — Ambulatory Visit (HOSPITAL_COMMUNITY)
Admission: RE | Admit: 2021-11-23 | Discharge: 2021-11-23 | Disposition: A | Discharge status: Home / Self Care | Payer: Federal, State, Local not specified - PPO | Source: Ambulatory Visit | Attending: Gastroenterology | Admitting: Gastroenterology

## 2021-11-23 DIAGNOSIS — R109 Unspecified abdominal pain: Secondary | ICD-10-CM | POA: Insufficient documentation

## 2021-11-26 ENCOUNTER — Other Ambulatory Visit: Payer: Self-pay | Admitting: *Deleted

## 2021-11-26 ENCOUNTER — Encounter (HOSPITAL_COMMUNITY): Payer: Self-pay | Admitting: Internal Medicine

## 2021-11-26 ENCOUNTER — Encounter: Payer: Self-pay | Admitting: *Deleted

## 2021-11-26 DIAGNOSIS — R109 Unspecified abdominal pain: Secondary | ICD-10-CM

## 2021-11-26 DIAGNOSIS — R197 Diarrhea, unspecified: Secondary | ICD-10-CM

## 2021-11-27 ENCOUNTER — Encounter: Payer: Self-pay | Admitting: General Surgery

## 2021-11-27 ENCOUNTER — Encounter (HOSPITAL_COMMUNITY)
Admission: RE | Admit: 2021-11-27 | Discharge: 2021-11-27 | Disposition: A | Discharge status: Home / Self Care | Payer: Federal, State, Local not specified - PPO | Source: Ambulatory Visit | Attending: Gastroenterology | Admitting: Gastroenterology

## 2021-11-27 ENCOUNTER — Ambulatory Visit: Payer: Federal, State, Local not specified - PPO | Admitting: General Surgery

## 2021-11-27 VITALS — BP 126/80 | HR 89 | Temp 98.2°F | Resp 12 | Ht 64.0 in | Wt 209.0 lb

## 2021-11-27 DIAGNOSIS — R109 Unspecified abdominal pain: Secondary | ICD-10-CM | POA: Diagnosis present

## 2021-11-27 DIAGNOSIS — K8020 Calculus of gallbladder without cholecystitis without obstruction: Secondary | ICD-10-CM | POA: Diagnosis not present

## 2021-11-27 MED ORDER — TECHNETIUM TC 99M MEBROFENIN IV KIT
5.0000 | PACK | Freq: Once | INTRAVENOUS | Status: AC | PRN
  Administered 2021-11-27: 5.2 via INTRAVENOUS

## 2021-11-28 NOTE — Progress Notes (Signed)
Stephanie Acosta; [id number]; 06/17/1990   HPI Patient is a 31-year-old white female who was referred to my care by Dr. Carver of gastroenterology for evaluation and treatment of upper abdominal pain and bloating.  She has had an extensive work-up and this only shows celiac disease.  She has been trying to treat this but continues to have epigastric abdominal pain, nausea, and bloating.  She did have an ultrasound which was positive for cholelithiasis.  Hepatobiliary scan was performed which revealed an ejection fraction of 46%.  She had variable reproducible symptoms.  She denies any fever, chills, or jaundice.  Her appetite is decreased and multiple different foods cause her symptoms. Past Medical History:  Diagnosis Date   Anxiety    Celiac disease sept 2015   Depression    Endometriosis    GERD (gastroesophageal reflux disease)    IBS (irritable bowel syndrome)    PCOS (polycystic ovarian syndrome) 2013   Pelvic pain in female    Wears contact lenses     Past Surgical History:  Procedure Laterality Date   BIOPSY  11/21/2021   Procedure: BIOPSY;  Surgeon: Carver, Charles K, DO;  Location: [facility];  Service: Endoscopy;;   DILATION AND EVACUATION N/A 07/09/2012   Procedure: DILATATION AND EVACUATION;  Surgeon: Richard M Holland, MD;  Location: [facility];  Service: Gynecology;  Laterality: N/A;   ESOPHAGOGASTRODUODENOSCOPY (EGD) WITH PROPOFOL N/A 11/21/2021   Procedure: ESOPHAGOGASTRODUODENOSCOPY (EGD) WITH PROPOFOL;  Surgeon: Carver, Charles K, DO;  Location: [facility];  Service: Endoscopy;  Laterality: N/A;  12:45pm, asa 2   LAPAROSCOPY N/A 09/07/2014   Procedure: LAPAROSCOPY DIAGNOSTIC;  Surgeon: Richard Holland, MD;  [facility];  Service: Gynecology;  Laterality: N/A;   WISDOM TOOTH EXTRACTION      Family History  Problem Relation Age of Onset   Depression Mother    Alcohol abuse Mother    Crohn's disease Mother    Colon polyps Father    Diabetes  Maternal Grandfather    Diabetes Paternal Grandmother    Bipolar disorder Maternal Aunt    Alcohol abuse Maternal Uncle    Alcohol abuse Maternal Uncle    Alcohol abuse Maternal Uncle    Alcohol abuse Maternal Uncle    Colon cancer Neg Hx        great grandfather had colon cancer but no first degree relatives    Current Outpatient Medications on File Prior to Visit  Medication Sig Dispense Refill   buPROPion (WELLBUTRIN XL) 300 MG 24 hr tablet Take 300 mg by mouth daily.     dexmethylphenidate (FOCALIN XR) 15 MG 24 hr capsule Take 15 mg by mouth every morning.     etonogestrel (NEXPLANON) 68 MG IMPL implant 1 each by Subdermal route once. In Left Arm, Placed in 2021     hydrOXYzine (ATARAX) 25 MG tablet Take 25 mg by mouth 3 (three) times daily as needed for anxiety.     pantoprazole (PROTONIX) 40 MG tablet Take 1 tablet (40 mg total) by mouth daily. 30 minutes before eating 30 tablet 5   promethazine (PHENERGAN) 12.5 MG tablet Take 1 tablet (12.5 mg total) by mouth every 8 (eight) hours as needed for nausea or vomiting. May take 2 tablets if needed every 8 hours. 30 tablet 0   propranolol (INDERAL) 20 MG tablet Take 20 mg by mouth 2 (two) times daily.     Rimegepant Sulfate (NURTEC) 75 MG TBDP Take 75 mg by mouth daily as   needed (migraines).     sucralfate (CARAFATE) 1 g tablet TAKE 1 TABLET(1 GRAM) BY MOUTH FOUR TIMES DAILY BEFORE MEALS AND AT BEDTIME 360 tablet 1   tizanidine (ZANAFLEX) 2 MG capsule Take 2 mg by mouth 3 (three) times daily as needed for muscle spasms.     topiramate (TOPAMAX) 25 MG tablet Take 25-50 mg by mouth See admin instructions. 50 mg in the morning, 25 mg in the evening     venlafaxine XR (EFFEXOR-XR) 150 MG 24 hr capsule Take 300 mg by mouth daily with breakfast.     No current facility-administered medications on file prior to visit.    Allergies  Allergen Reactions   Tuberculin Ppd Hives and Swelling    Skin Test    Sulfa Antibiotics Rash    Childhood  reaction    Social History   Substance and Sexual Activity  Alcohol Use Not Currently   Comment: none since 2022    Social History   Tobacco Use  Smoking Status Never  Smokeless Tobacco Never    Review of Systems  Constitutional:  Positive for malaise/fatigue.  HENT: Negative.    Eyes: Negative.   Respiratory: Negative.    Cardiovascular: Negative.   Gastrointestinal:  Positive for abdominal pain, heartburn, nausea and vomiting.  Genitourinary: Negative.   Musculoskeletal:  Positive for back pain and joint pain.  Skin: Negative.   Neurological:  Positive for dizziness and headaches.  Endo/Heme/Allergies: Negative.   Psychiatric/Behavioral: Negative.      Objective   Vitals:   11/27/21 1428  BP: 126/80  Pulse: 89  Resp: 12  Temp: 98.2 F (36.8 C)  SpO2: 99%    Physical Exam Vitals reviewed.  Constitutional:      Appearance: Normal appearance. She is not ill-appearing.  HENT:     Head: Normocephalic and atraumatic.  Eyes:     General: No scleral icterus. Cardiovascular:     Rate and Rhythm: Normal rate and regular rhythm.     Heart sounds: Normal heart sounds. No murmur heard.    No friction rub. No gallop.  Pulmonary:     Effort: Pulmonary effort is normal. No respiratory distress.     Breath sounds: Normal breath sounds. No stridor. No wheezing, rhonchi or rales.  Abdominal:     General: Bowel sounds are normal. There is no distension.     Palpations: Abdomen is soft. There is no mass.     Tenderness: There is no abdominal tenderness. There is no guarding or rebound.     Hernia: No hernia is present.     Comments: Nonspecific discomfort is noted to deep palpation in the epigastric and right upper quadrant regions.  Skin:    General: Skin is warm and dry.  Neurological:     Mental Status: She is alert and oriented to person, place, and time.   GI notes reviewed Ultrasound and HIDA scan reports reviewed  Assessment  Patient does have  cholelithiasis.  She has a myriad of other abdominal problems and I did tell her that I could not guarantee that taking out her gallbladder would solve all of her GI issues.  She understands that.  She would like to proceed with cholecystectomy. Plan  Patient is scheduled for a robotic assisted laparoscopic cholecystectomy on 12/13/2021.  The risks and benefits of the procedure including bleeding, infection, hepatobiliary injury, and the possibility of an open procedure were fully explained to the patient, who gave informed consent.  She does realize that this   is my initial work in the robotic approach, but this will be proctored. 

## 2021-11-29 ENCOUNTER — Ambulatory Visit (HOSPITAL_COMMUNITY): Payer: Federal, State, Local not specified - PPO

## 2021-11-30 NOTE — H&P (Signed)
Stephanie Acosta; [id number]; 1990/11/06   HPI Patient is a 31 year old white female who was referred to my care by Dr. Abbey Chatters of gastroenterology for evaluation and treatment of upper abdominal pain and bloating.  She has had an extensive work-up and this only shows celiac disease.  She has been trying to treat this but continues to have epigastric abdominal pain, nausea, and bloating.  She did have an ultrasound which was positive for cholelithiasis.  Hepatobiliary scan was performed which revealed an ejection fraction of 46%.  She had variable reproducible symptoms.  She denies any fever, chills, or jaundice.  Her appetite is decreased and multiple different foods cause her symptoms. Past Medical History:  Diagnosis Date   Anxiety    Celiac disease sept 2015   Depression    Endometriosis    GERD (gastroesophageal reflux disease)    IBS (irritable bowel syndrome)    PCOS (polycystic ovarian syndrome) 2013   Pelvic pain in female    Wears contact lenses     Past Surgical History:  Procedure Laterality Date   BIOPSY  11/21/2021   Procedure: BIOPSY;  Surgeon: [name], DO;  Location: [facility];  Service: Endoscopy;;   DILATION AND EVACUATION N/A 07/09/2012   Procedure: DILATATION AND EVACUATION;  Surgeon: Margarette Asal, MD;  Location: [place] [facility];  Service: Gynecology;  Laterality: N/A;   ESOPHAGOGASTRODUODENOSCOPY (EGD) WITH PROPOFOL N/A 11/21/2021   Procedure: ESOPHAGOGASTRODUODENOSCOPY (EGD) WITH PROPOFOL;  Surgeon: [name], DO;  Location: [facility];  Service: Endoscopy;  Laterality: N/A;  12:45pm, asa 2   LAPAROSCOPY N/A 09/07/2014   Procedure: LAPAROSCOPY DIAGNOSTIC;  Surgeon: Molli Posey, MD;  Location: [place];  Service: Gynecology;  Laterality: N/A;   WISDOM TOOTH EXTRACTION      Family History  Problem Relation Age of Onset   Depression Mother    Alcohol abuse Mother    Crohn's disease Mother    Colon polyps Father    Diabetes  Maternal Grandfather    Diabetes Paternal Grandmother    Bipolar disorder Maternal Aunt    Alcohol abuse Maternal Uncle    Alcohol abuse Maternal Uncle    Alcohol abuse Maternal Uncle    Alcohol abuse Maternal Uncle    Colon cancer Neg Hx        great grandfather had colon cancer but no first degree relatives    Current Outpatient Medications on File Prior to Visit  Medication Sig Dispense Refill   buPROPion (WELLBUTRIN XL) 300 MG 24 hr tablet Take 300 mg by mouth daily.     dexmethylphenidate (FOCALIN XR) 15 MG 24 hr capsule Take 15 mg by mouth every morning.     etonogestrel (NEXPLANON) 68 MG IMPL implant 1 each by Subdermal route once. In Left Arm, Placed in 2021     hydrOXYzine (ATARAX) 25 MG tablet Take 25 mg by mouth 3 (three) times daily as needed for anxiety.     pantoprazole (PROTONIX) 40 MG tablet Take 1 tablet (40 mg total) by mouth daily. 30 minutes before eating 30 tablet 5   promethazine (PHENERGAN) 12.5 MG tablet Take 1 tablet (12.5 mg total) by mouth every 8 (eight) hours as needed for nausea or vomiting. May take 2 tablets if needed every 8 hours. 30 tablet 0   propranolol (INDERAL) 20 MG tablet Take 20 mg by mouth 2 (two) times daily.     Rimegepant Sulfate (NURTEC) 75 MG TBDP Take 75 mg by mouth daily as  needed (migraines).     sucralfate (CARAFATE) 1 g tablet TAKE 1 TABLET(1 GRAM) BY MOUTH FOUR TIMES DAILY BEFORE MEALS AND AT BEDTIME 360 tablet 1   tizanidine (ZANAFLEX) 2 MG capsule Take 2 mg by mouth 3 (three) times daily as needed for muscle spasms.     topiramate (TOPAMAX) 25 MG tablet Take 25-50 mg by mouth See admin instructions. 50 mg in the morning, 25 mg in the evening     venlafaxine XR (EFFEXOR-XR) 150 MG 24 hr capsule Take 300 mg by mouth daily with breakfast.     No current facility-administered medications on file prior to visit.    Allergies  Allergen Reactions   Tuberculin Ppd Hives and Swelling    Skin Test    Sulfa Antibiotics Rash    Childhood  reaction    Social History   Substance and Sexual Activity  Alcohol Use Not Currently   Comment: none since 2022    Social History   Tobacco Use  Smoking Status Never  Smokeless Tobacco Never    Review of Systems  Constitutional:  Positive for malaise/fatigue.  HENT: Negative.    Eyes: Negative.   Respiratory: Negative.    Cardiovascular: Negative.   Gastrointestinal:  Positive for abdominal pain, heartburn, nausea and vomiting.  Genitourinary: Negative.   Musculoskeletal:  Positive for back pain and joint pain.  Skin: Negative.   Neurological:  Positive for dizziness and headaches.  Endo/Heme/Allergies: Negative.   Psychiatric/Behavioral: Negative.      Objective   Vitals:   11/27/21 1428  BP: 126/80  Pulse: 89  Resp: 12  Temp: 98.2 F (36.8 C)  SpO2: 99%    Physical Exam Vitals reviewed.  Constitutional:      Appearance: Normal appearance. She is not ill-appearing.  HENT:     Head: Normocephalic and atraumatic.  Eyes:     General: No scleral icterus. Cardiovascular:     Rate and Rhythm: Normal rate and regular rhythm.     Heart sounds: Normal heart sounds. No murmur heard.    No friction rub. No gallop.  Pulmonary:     Effort: Pulmonary effort is normal. No respiratory distress.     Breath sounds: Normal breath sounds. No stridor. No wheezing, rhonchi or rales.  Abdominal:     General: Bowel sounds are normal. There is no distension.     Palpations: Abdomen is soft. There is no mass.     Tenderness: There is no abdominal tenderness. There is no guarding or rebound.     Hernia: No hernia is present.     Comments: Nonspecific discomfort is noted to deep palpation in the epigastric and right upper quadrant regions.  Skin:    General: Skin is warm and dry.  Neurological:     Mental Status: She is alert and oriented to person, place, and time.   GI notes reviewed Ultrasound and HIDA scan reports reviewed  Assessment  Patient does have  cholelithiasis.  She has a myriad of other abdominal problems and I did tell her that I could not guarantee that taking out her gallbladder would solve all of her GI issues.  She understands that.  She would like to proceed with cholecystectomy. Plan  Patient is scheduled for a robotic assisted laparoscopic cholecystectomy on 12/13/2021.  The risks and benefits of the procedure including bleeding, infection, hepatobiliary injury, and the possibility of an open procedure were fully explained to the patient, who gave informed consent.  She does realize that this  is my initial work in the robotic approach, but this will be proctored.

## 2021-12-04 ENCOUNTER — Other Ambulatory Visit: Payer: Self-pay

## 2021-12-04 DIAGNOSIS — R197 Diarrhea, unspecified: Secondary | ICD-10-CM

## 2021-12-04 NOTE — Telephone Encounter (Signed)
FYI:  Labs released and I have the pt's FMLA paperwork to be filled out.

## 2021-12-04 NOTE — Telephone Encounter (Signed)
Dena, I put labs in just now. Just need to be released. Cdiff and GI profile. I put it for [facility]. :)

## 2021-12-04 NOTE — Telephone Encounter (Signed)
Hey Stephanie Acosta Did you put the labs in or I can?

## 2021-12-05 ENCOUNTER — Telehealth: Payer: Self-pay

## 2021-12-05 NOTE — Telephone Encounter (Signed)
Pt's FMLA paperwork on your desk in yellow folder. Once you have completed your part I will do other section.

## 2021-12-06 NOTE — Telephone Encounter (Signed)
noted 

## 2021-12-06 NOTE — Telephone Encounter (Signed)
Faxed FMLA paperwork to [employer] @ [phone].

## 2021-12-06 NOTE — Telephone Encounter (Signed)
Completed. This just needs to be faxed to [employer]. I believe what I filled out should cover everything without having to be sent to Dr Arnoldo Morale, as I listed the dates in my part.

## 2021-12-10 NOTE — Patient Instructions (Addendum)
Stephanie Acosta  12/10/2021     @PREFPERIOPPHARMACY @   Your procedure is scheduled on  12/13/2021.   Report to [facility] at  0600  A.M.   Call this number if you have problems the morning of surgery:  [phone]  If you experience any cold or flu symptoms such as cough, fever, chills, shortness of breath, etc. between now and your scheduled surgery, please notify us at the above number.   Remember:  Do not eat or drink after midnight.      Take these medicines the morning of surgery with A SIP OF WATER         wellbutrin,hydroxyzine, protonix, phenergan(if needed), propranolol, nurtec, zanaflex, tiopamax, effexor.     Do not wear jewelry, make-up or nail polish.  Do not wear lotions, powders, or perfumes, or deodorant.  Do not shave 48 hours prior to surgery.  Men may shave face and neck.  Do not bring valuables to the hospital.  [facility] is not responsible for any belongings or valuables.  Contacts, dentures or bridgework may not be worn into surgery.  Leave your suitcase in the car.  After surgery it may be brought to your room.  For patients admitted to the hospital, discharge time will be determined by your treatment team.  Patients discharged the day of surgery will not be allowed to drive home and must have someone with them for 24 hours.    Special instructions:   DO NOT smoke tobacco or vape for 24 hours before your procedure.   Please read over the following fact sheets that you were given. Pain Booklet, Coughing and Deep Breathing, Surgical Site Infection Prevention, Anesthesia Post-op Instructions, and Care and Recovery After Surgery       Minimally Invasive Cholecystectomy, Care After The following information offers guidance on how to care for yourself after your procedure. Your health care provider may also give you more specific instructions. If you have problems or questions, contact your health care provider. What can I expect  after the procedure? After the procedure, it is common to have: Pain at your incision sites. You will be given medicines to control this pain. Mild nausea or vomiting. Bloating and possible shoulder pain from the gas that was used during the procedure. Follow these instructions at home: Medicines Take over-the-counter and prescription medicines only as told by your health care provider. If you were prescribed an antibiotic medicine, take it as told by your health care provider. Do not stop using the antibiotic even if you start to feel better. Ask your health care provider if the medicine prescribed to you: Requires you to avoid driving or using machinery. Can cause constipation. You may need to take these actions to prevent or treat constipation: Drink enough fluid to keep your urine pale yellow. Take over-the-counter or prescription medicines. Eat foods that are high in fiber, such as beans, whole grains, and fresh fruits and vegetables. Limit foods that are high in fat and processed sugars, such as fried or sweet foods. Incision care  Follow instructions from your health care provider about how to take care of your incisions. Make sure you: Wash your hands with soap and water for at least 20 seconds before and after you change your bandage (dressing). If soap and water are not available, use hand sanitizer. Change your dressing as told by your health care provider. Leave stitches (sutures), skin glue, or adhesive strips in place. These  skin closures may need to be in place for 2 weeks or longer. If adhesive strip edges start to loosen and curl up, you may trim the loose edges. Do not remove adhesive strips completely unless your health care provider tells you to do that. Do not take baths, swim, or use a hot tub until your health care provider approves. Ask your health care provider if you may take showers. You may only be allowed to take sponge baths. Check your incision area every day for  signs of infection. Check for: More redness, swelling, or pain. Fluid or blood. Warmth. Pus or a bad smell. Activity Rest as told by your health care provider. Do not do activities that require a lot of effort. Avoid sitting for a long time without moving. Get up to take short walks every 1-2 hours. This is important to improve blood flow and breathing. Ask for help if you feel weak or unsteady. Do not lift anything that is heavier than 10 lb (4.5 kg), or the limit that you are told, until your health care provider says that it is safe. Do not play contact sports until your health care provider approves. Do not return to work or school until your health care provider approves. Return to your normal activities as told by your health care provider. Ask your health care provider what activities are safe for you. General instructions If you were given a sedative during the procedure, it can affect you for several hours. Do not drive or operate machinery until your health care provider says that it is safe. Keep all follow-up visits. This is important. Contact a health care provider if: You develop a rash. You have more redness, swelling, or pain around your incisions. You have fluid or blood coming from your incisions. Your incisions feel warm to the touch. You have pus or a bad smell coming from your incisions. You have a fever. One or more of your incisions breaks open. Get help right away if: You have trouble breathing. You have chest pain. You have more pain in your shoulders. You faint or feel dizzy when you stand. You have severe pain in your abdomen. You have nausea or vomiting that lasts for more than one day. You have leg pain that is new or unusual, or if it is localized to one specific spot. These symptoms may represent a serious problem that is an emergency. Do not wait to see if the symptoms will go away. Get medical help right away. Call your local emergency services (911 in  the [place] Do not drive yourself to the hospital. Summary After your procedure, it is common to have pain at the incision sites. You may also have nausea or bloating. Follow your health care provider's instructions about medicine, activity restrictions, and caring for your incision areas. Do not do activities that require a lot of effort. Contact a health care provider if you have a fever or other signs of infection, such as more redness, swelling, or pain around the incisions. Get help right away if you have chest pain, increasing pain in the shoulders, or trouble breathing. This information is not intended to replace advice given to you by your health care provider. Make sure you discuss any questions you have with your health care provider. Document Revised: 07/25/2020 Document Reviewed: 07/25/2020 Elsevier Patient Education  [place] Anesthesia, Adult, Care After The following information offers guidance on how to care for yourself after your procedure. Your health care provider  may also give you more specific instructions. If you have problems or questions, contact your health care provider. What can I expect after the procedure? After the procedure, it is common for people to: Have pain or discomfort at the IV site. Have nausea or vomiting. Have a sore throat or hoarseness. Have trouble concentrating. Feel cold or chills. Feel weak, sleepy, or tired (fatigue). Have soreness and body aches. These can affect parts of the body that were not involved in surgery. Follow these instructions at home: For the time period you were told by your health care provider:  Rest. Do not participate in activities where you could fall or become injured. Do not drive or use machinery. Do not drink alcohol. Do not take sleeping pills or medicines that cause drowsiness. Do not make important decisions or sign legal documents. Do not take care of children on your own. General  instructions Drink enough fluid to keep your urine pale yellow. If you have sleep apnea, surgery and certain medicines can increase your risk for breathing problems. Follow instructions from your health care provider about wearing your sleep device: Anytime you are sleeping, including during daytime naps. While taking prescription pain medicines, sleeping medicines, or medicines that make you drowsy. Return to your normal activities as told by your health care provider. Ask your health care provider what activities are safe for you. Take over-the-counter and prescription medicines only as told by your health care provider. Do not use any products that contain nicotine or tobacco. These products include cigarettes, chewing tobacco, and vaping devices, such as e-cigarettes. These can delay incision healing after surgery. If you need help quitting, ask your health care provider. Contact a health care provider if: You have nausea or vomiting that does not get better with medicine. You vomit every time you eat or drink. You have pain that does not get better with medicine. You cannot urinate or have bloody urine. You develop a skin rash. You have a fever. Get help right away if: You have trouble breathing. You have chest pain. You vomit blood. These symptoms may be an emergency. Get help right away. Call 911. Do not wait to see if the symptoms will go away. Do not drive yourself to the hospital. Summary After the procedure, it is common to have a sore throat, hoarseness, nausea, vomiting, or to feel weak, sleepy, or fatigue. For the time period you were told by your health care provider, do not drive or use machinery. Get help right away if you have difficulty breathing, have chest pain, or vomit blood. These symptoms may be an emergency. This information is not intended to replace advice given to you by your health care provider. Make sure you discuss any questions you have with your health  care provider. Document Revised: 04/20/2021 Document Reviewed: 04/20/2021 Elsevier Patient Education  [place]. How to Use Chlorhexidine Before Surgery Chlorhexidine gluconate (CHG) is a germ-killing (antiseptic) solution that is used to clean the skin. It can get rid of the bacteria that normally live on the skin and can keep them away for about 24 hours. To clean your skin with CHG, you may be given: A CHG solution to use in the shower or as part of a sponge bath. A prepackaged cloth that contains CHG. Cleaning your skin with CHG may help lower the risk for infection: While you are staying in the intensive care unit of the hospital. If you have a vascular access, such as a central line, to  provide short-term or long-term access to your veins. If you have a catheter to drain urine from your bladder. If you are on a ventilator. A ventilator is a machine that helps you breathe by moving air in and out of your lungs. After surgery. What are the risks? Risks of using CHG include: A skin reaction. Hearing loss, if CHG gets in your ears and you have a perforated eardrum. Eye injury, if CHG gets in your eyes and is not rinsed out. The CHG product catching fire. Make sure that you avoid smoking and flames after applying CHG to your skin. Do not use CHG: If you have a chlorhexidine allergy or have previously reacted to chlorhexidine. On babies younger than 35 months of age. How to use CHG solution Use CHG only as told by your health care provider, and follow the instructions on the label. Use the full amount of CHG as directed. Usually, this is one bottle. During a shower Follow these steps when using CHG solution during a shower (unless your health care provider gives you different instructions): Start the shower. Use your normal soap and shampoo to wash your face and hair. Turn off the shower or move out of the shower stream. Pour the CHG onto a clean washcloth. Do not use any type  of brush or rough-edged sponge. Starting at your neck, lather your body down to your toes. Make sure you follow these instructions: If you will be having surgery, pay special attention to the part of your body where you will be having surgery. Scrub this area for at least 1 minute. Do not use CHG on your head or face. If the solution gets into your ears or eyes, rinse them well with water. Avoid your genital area. Avoid any areas of skin that have broken skin, cuts, or scrapes. Scrub your back and under your arms. Make sure to wash skin folds. Let the lather sit on your skin for 1-2 minutes or as long as told by your health care provider. Thoroughly rinse your entire body in the shower. Make sure that all body creases and crevices are rinsed well. Dry off with a clean towel. Do not put any substances on your body afterward--such as powder, lotion, or perfume--unless you are told to do so by your health care provider. Only use lotions that are recommended by the manufacturer. Put on clean clothes or pajamas. If it is the night before your surgery, sleep in clean sheets.  During a sponge bath Follow these steps when using CHG solution during a sponge bath (unless your health care provider gives you different instructions): Use your normal soap and shampoo to wash your face and hair. Pour the CHG onto a clean washcloth. Starting at your neck, lather your body down to your toes. Make sure you follow these instructions: If you will be having surgery, pay special attention to the part of your body where you will be having surgery. Scrub this area for at least 1 minute. Do not use CHG on your head or face. If the solution gets into your ears or eyes, rinse them well with water. Avoid your genital area. Avoid any areas of skin that have broken skin, cuts, or scrapes. Scrub your back and under your arms. Make sure to wash skin folds. Let the lather sit on your skin for 1-2 minutes or as long as told by  your health care provider. Using a different clean, wet washcloth, thoroughly rinse your entire body. Make sure that  all body creases and crevices are rinsed well. Dry off with a clean towel. Do not put any substances on your body afterward--such as powder, lotion, or perfume--unless you are told to do so by your health care provider. Only use lotions that are recommended by the manufacturer. Put on clean clothes or pajamas. If it is the night before your surgery, sleep in clean sheets. How to use CHG prepackaged cloths Only use CHG cloths as told by your health care provider, and follow the instructions on the label. Use the CHG cloth on clean, dry skin. Do not use the CHG cloth on your head or face unless your health care provider tells you to. When washing with the CHG cloth: Avoid your genital area. Avoid any areas of skin that have broken skin, cuts, or scrapes. Before surgery Follow these steps when using a CHG cloth to clean before surgery (unless your health care provider gives you different instructions): Using the CHG cloth, vigorously scrub the part of your body where you will be having surgery. Scrub using a back-and-forth motion for 3 minutes. The area on your body should be completely wet with CHG when you are done scrubbing. Do not rinse. Discard the cloth and let the area air-dry. Do not put any substances on the area afterward, such as powder, lotion, or perfume. Put on clean clothes or pajamas. If it is the night before your surgery, sleep in clean sheets.  For general bathing Follow these steps when using CHG cloths for general bathing (unless your health care provider gives you different instructions). Use a separate CHG cloth for each area of your body. Make sure you wash between any folds of skin and between your fingers and toes. Wash your body in the following order, switching to a new cloth after each step: The front of your neck, shoulders, and chest. Both of your  arms, under your arms, and your hands. Your stomach and groin area, avoiding the genitals. Your right leg and foot. Your left leg and foot. The back of your neck, your back, and your buttocks. Do not rinse. Discard the cloth and let the area air-dry. Do not put any substances on your body afterward--such as powder, lotion, or perfume--unless you are told to do so by your health care provider. Only use lotions that are recommended by the manufacturer. Put on clean clothes or pajamas. Contact a health care provider if: Your skin gets irritated after scrubbing. You have questions about using your solution or cloth. You swallow any chlorhexidine. Call your local poison control center (1-225-698-8023 in the [place] Get help right away if: Your eyes itch badly, or they become very red or swollen. Your skin itches badly and is red or swollen. Your hearing changes. You have trouble seeing. You have swelling or tingling in your mouth or throat. You have trouble breathing. These symptoms may represent a serious problem that is an emergency. Do not wait to see if the symptoms will go away. Get medical help right away. Call your local emergency services (911 in the [place] Do not drive yourself to the hospital. Summary Chlorhexidine gluconate (CHG) is a germ-killing (antiseptic) solution that is used to clean the skin. Cleaning your skin with CHG may help to lower your risk for infection. You may be given CHG to use for bathing. It may be in a bottle or in a prepackaged cloth to use on your skin. Carefully follow your health care provider's instructions and the instructions on the product  label. Do not use CHG if you have a chlorhexidine allergy. Contact your health care provider if your skin gets irritated after scrubbing. This information is not intended to replace advice given to you by your health care provider. Make sure you discuss any questions you have with your health care provider. Document  Revised: 05/21/2021 Document Reviewed: 04/03/2020 Elsevier Patient Education  [place].

## 2021-12-11 ENCOUNTER — Encounter (HOSPITAL_COMMUNITY)
Admission: RE | Admit: 2021-12-11 | Discharge: 2021-12-11 | Disposition: A | Discharge status: Home / Self Care | Payer: Federal, State, Local not specified - PPO | Source: Ambulatory Visit | Attending: General Surgery | Admitting: General Surgery

## 2021-12-11 ENCOUNTER — Encounter (HOSPITAL_COMMUNITY): Payer: Self-pay

## 2021-12-11 ENCOUNTER — Other Ambulatory Visit: Payer: Self-pay

## 2021-12-11 VITALS — BP 130/68 | HR 88 | Temp 98.2°F | Resp 16 | Ht 64.0 in | Wt 202.0 lb

## 2021-12-11 DIAGNOSIS — Z01818 Encounter for other preprocedural examination: Secondary | ICD-10-CM | POA: Insufficient documentation

## 2021-12-11 DIAGNOSIS — D649 Anemia, unspecified: Secondary | ICD-10-CM | POA: Diagnosis not present

## 2021-12-11 DIAGNOSIS — F419 Anxiety disorder, unspecified: Secondary | ICD-10-CM | POA: Diagnosis not present

## 2021-12-11 DIAGNOSIS — K8020 Calculus of gallbladder without cholecystitis without obstruction: Secondary | ICD-10-CM | POA: Diagnosis present

## 2021-12-11 DIAGNOSIS — F32A Depression, unspecified: Secondary | ICD-10-CM | POA: Diagnosis not present

## 2021-12-11 DIAGNOSIS — D509 Iron deficiency anemia, unspecified: Secondary | ICD-10-CM | POA: Insufficient documentation

## 2021-12-11 DIAGNOSIS — K219 Gastro-esophageal reflux disease without esophagitis: Secondary | ICD-10-CM | POA: Diagnosis not present

## 2021-12-11 DIAGNOSIS — D759 Disease of blood and blood-forming organs, unspecified: Secondary | ICD-10-CM | POA: Diagnosis not present

## 2021-12-11 DIAGNOSIS — K900 Celiac disease: Secondary | ICD-10-CM | POA: Diagnosis not present

## 2021-12-11 LAB — TYPE AND SCREEN
ABO/RH(D): O POS
Antibody Screen: NEGATIVE

## 2021-12-11 LAB — POCT PREGNANCY, URINE: Preg Test, Ur: NEGATIVE

## 2021-12-12 NOTE — Telephone Encounter (Signed)
Faxed FMLA paperwork to [employer] @ [phone].

## 2021-12-13 ENCOUNTER — Ambulatory Visit (HOSPITAL_COMMUNITY): Payer: Federal, State, Local not specified - PPO | Admitting: Certified Registered Nurse Anesthetist

## 2021-12-13 ENCOUNTER — Ambulatory Visit (HOSPITAL_COMMUNITY)
Admission: RE | Admit: 2021-12-13 | Discharge: 2021-12-13 | Disposition: A | Discharge status: Home / Self Care | Payer: Federal, State, Local not specified - PPO | Source: Ambulatory Visit | Attending: General Surgery | Admitting: General Surgery

## 2021-12-13 ENCOUNTER — Encounter (HOSPITAL_COMMUNITY): Payer: Self-pay | Admitting: General Surgery

## 2021-12-13 ENCOUNTER — Encounter (HOSPITAL_COMMUNITY)
Admission: RE | Disposition: A | Discharge status: Home / Self Care | Payer: Self-pay | Source: Ambulatory Visit | Attending: General Surgery

## 2021-12-13 ENCOUNTER — Other Ambulatory Visit: Payer: Self-pay

## 2021-12-13 DIAGNOSIS — F419 Anxiety disorder, unspecified: Secondary | ICD-10-CM | POA: Insufficient documentation

## 2021-12-13 DIAGNOSIS — K8020 Calculus of gallbladder without cholecystitis without obstruction: Secondary | ICD-10-CM | POA: Insufficient documentation

## 2021-12-13 DIAGNOSIS — F32A Depression, unspecified: Secondary | ICD-10-CM | POA: Insufficient documentation

## 2021-12-13 DIAGNOSIS — D649 Anemia, unspecified: Secondary | ICD-10-CM | POA: Insufficient documentation

## 2021-12-13 DIAGNOSIS — K900 Celiac disease: Secondary | ICD-10-CM | POA: Insufficient documentation

## 2021-12-13 DIAGNOSIS — D759 Disease of blood and blood-forming organs, unspecified: Secondary | ICD-10-CM | POA: Insufficient documentation

## 2021-12-13 DIAGNOSIS — Z01818 Encounter for other preprocedural examination: Secondary | ICD-10-CM

## 2021-12-13 DIAGNOSIS — K219 Gastro-esophageal reflux disease without esophagitis: Secondary | ICD-10-CM | POA: Insufficient documentation

## 2021-12-13 SURGERY — CHOLECYSTECTOMY, ROBOT-ASSISTED, LAPAROSCOPIC
Anesthesia: General | Site: Abdomen

## 2021-12-13 MED ORDER — LIDOCAINE HCL (PF) 2 % IJ SOLN
INTRAMUSCULAR | Status: AC
  Filled 2021-12-13: qty 5

## 2021-12-13 MED ORDER — EPHEDRINE SULFATE (PRESSORS) 50 MG/ML IJ SOLN
INTRAMUSCULAR | Status: DC | PRN
  Administered 2021-12-13: 5 mg via INTRAVENOUS

## 2021-12-13 MED ORDER — BUPIVACAINE LIPOSOME 1.3 % IJ SUSP
INTRAMUSCULAR | Status: AC
  Filled 2021-12-13: qty 20

## 2021-12-13 MED ORDER — HYDROMORPHONE HCL 1 MG/ML IJ SOLN
0.2500 mg | INTRAMUSCULAR | Status: DC | PRN
  Administered 2021-12-13 (×3): 0.5 mg via INTRAVENOUS
  Filled 2021-12-13 (×3): qty 0.5

## 2021-12-13 MED ORDER — 0.9 % SODIUM CHLORIDE (POUR BTL) OPTIME
TOPICAL | Status: DC | PRN
  Administered 2021-12-13: 1000 mL

## 2021-12-13 MED ORDER — KETOROLAC TROMETHAMINE 30 MG/ML IJ SOLN
INTRAMUSCULAR | Status: AC
  Filled 2021-12-13: qty 1

## 2021-12-13 MED ORDER — CHLORHEXIDINE GLUCONATE 0.12 % MT SOLN
15.0000 mL | Freq: Once | OROMUCOSAL | Status: AC
  Administered 2021-12-13: 15 mL via OROMUCOSAL
  Filled 2021-12-13: qty 15

## 2021-12-13 MED ORDER — AMISULPRIDE (ANTIEMETIC) 5 MG/2ML IV SOLN
10.0000 mg | Freq: Once | INTRAVENOUS | Status: AC
  Administered 2021-12-13: 10 mg via INTRAVENOUS

## 2021-12-13 MED ORDER — INDOCYANINE GREEN 25 MG IV SOLR
INTRAVENOUS | Status: AC
  Filled 2021-12-13: qty 10

## 2021-12-13 MED ORDER — PROPOFOL 10 MG/ML IV BOLUS
INTRAVENOUS | Status: AC
  Filled 2021-12-13: qty 20

## 2021-12-13 MED ORDER — KETOROLAC TROMETHAMINE 30 MG/ML IJ SOLN
INTRAMUSCULAR | Status: DC | PRN
  Administered 2021-12-13: 30 mg via INTRAVENOUS

## 2021-12-13 MED ORDER — LACTATED RINGERS IV SOLN
INTRAVENOUS | Status: DC
  Administered 2021-12-13: 1000 mL via INTRAVENOUS

## 2021-12-13 MED ORDER — ORAL CARE MOUTH RINSE: 15.0000 mL | Freq: Once | OROMUCOSAL | Status: AC

## 2021-12-13 MED ORDER — MIDAZOLAM HCL 2 MG/2ML IJ SOLN
INTRAMUSCULAR | Status: DC | PRN
  Administered 2021-12-13: 2 mg via INTRAVENOUS

## 2021-12-13 MED ORDER — LIDOCAINE HCL (CARDIAC) PF 100 MG/5ML IV SOSY
PREFILLED_SYRINGE | INTRAVENOUS | Status: DC | PRN
  Administered 2021-12-13: 60 mg via INTRATRACHEAL

## 2021-12-13 MED ORDER — SCOPOLAMINE 1 MG/3DAYS TD PT72
1.0000 | MEDICATED_PATCH | Freq: Once | TRANSDERMAL | Status: DC
  Administered 2021-12-13: 1.5 mg via TRANSDERMAL

## 2021-12-13 MED ORDER — DEXAMETHASONE SODIUM PHOSPHATE 10 MG/ML IJ SOLN
INTRAMUSCULAR | Status: AC
  Filled 2021-12-13: qty 1

## 2021-12-13 MED ORDER — INDOCYANINE GREEN 25 MG IV SOLR
2.5000 mg | Freq: Once | INTRAVENOUS | Status: AC
  Administered 2021-12-13: 2.5 mg via INTRAVENOUS
  Filled 2021-12-13: qty 10

## 2021-12-13 MED ORDER — SCOPOLAMINE 1 MG/3DAYS TD PT72
MEDICATED_PATCH | TRANSDERMAL | Status: AC
  Filled 2021-12-13: qty 1

## 2021-12-13 MED ORDER — MIDAZOLAM HCL 2 MG/2ML IJ SOLN
INTRAMUSCULAR | Status: AC
  Filled 2021-12-13: qty 2

## 2021-12-13 MED ORDER — HEMOSTATIC AGENTS (NO CHARGE) OPTIME: TOPICAL | Status: DC | PRN

## 2021-12-13 MED ORDER — MEPERIDINE HCL 50 MG/ML IJ SOLN: 6.2500 mg | INTRAMUSCULAR | Status: DC | PRN

## 2021-12-13 MED ORDER — AMISULPRIDE (ANTIEMETIC) 5 MG/2ML IV SOLN
INTRAVENOUS | Status: AC
  Filled 2021-12-13: qty 4

## 2021-12-13 MED ORDER — ROCURONIUM BROMIDE 10 MG/ML (PF) SYRINGE
PREFILLED_SYRINGE | INTRAVENOUS | Status: DC | PRN
  Administered 2021-12-13: 10 mg via INTRAVENOUS
  Administered 2021-12-13: 60 mg via INTRAVENOUS
  Administered 2021-12-13: 20 mg via INTRAVENOUS

## 2021-12-13 MED ORDER — DEXAMETHASONE SODIUM PHOSPHATE 10 MG/ML IJ SOLN
INTRAMUSCULAR | Status: DC | PRN
  Administered 2021-12-13: 10 mg via INTRAVENOUS

## 2021-12-13 MED ORDER — HYDROCODONE-ACETAMINOPHEN 5-325 MG PO TABS: 1.0000 | ORAL_TABLET | ORAL | 0 refills | Status: DC | PRN

## 2021-12-13 MED ORDER — PHENYLEPHRINE 80 MCG/ML (10ML) SYRINGE FOR IV PUSH (FOR BLOOD PRESSURE SUPPORT)
PREFILLED_SYRINGE | INTRAVENOUS | Status: DC | PRN
  Administered 2021-12-13 (×2): 80 ug via INTRAVENOUS

## 2021-12-13 MED ORDER — ONDANSETRON HCL 4 MG/2ML IJ SOLN
INTRAMUSCULAR | Status: AC
  Filled 2021-12-13: qty 2

## 2021-12-13 MED ORDER — PROPOFOL 10 MG/ML IV BOLUS
INTRAVENOUS | Status: DC | PRN
  Administered 2021-12-13: 200 mg via INTRAVENOUS

## 2021-12-13 MED ORDER — FENTANYL CITRATE (PF) 100 MCG/2ML IJ SOLN
INTRAMUSCULAR | Status: DC | PRN
  Administered 2021-12-13: 50 ug via INTRAVENOUS
  Administered 2021-12-13: 100 ug via INTRAVENOUS
  Administered 2021-12-13 (×2): 50 ug via INTRAVENOUS

## 2021-12-13 MED ORDER — ONDANSETRON HCL 4 MG/2ML IJ SOLN: 4.0000 mg | Freq: Once | INTRAMUSCULAR | Status: DC | PRN

## 2021-12-13 MED ORDER — ONDANSETRON HCL 4 MG/2ML IJ SOLN
INTRAMUSCULAR | Status: DC | PRN
  Administered 2021-12-13: 4 mg via INTRAVENOUS

## 2021-12-13 MED ORDER — ROCURONIUM BROMIDE 10 MG/ML (PF) SYRINGE
PREFILLED_SYRINGE | INTRAVENOUS | Status: AC
  Filled 2021-12-13: qty 10

## 2021-12-13 MED ORDER — CHLORHEXIDINE GLUCONATE CLOTH 2 % EX PADS: 6.0000 | MEDICATED_PAD | Freq: Once | CUTANEOUS | Status: DC

## 2021-12-13 MED ORDER — FENTANYL CITRATE (PF) 250 MCG/5ML IJ SOLN
INTRAMUSCULAR | Status: AC
  Filled 2021-12-13: qty 5

## 2021-12-13 MED ORDER — CEFAZOLIN SODIUM-DEXTROSE 2-4 GM/100ML-% IV SOLN
2.0000 g | INTRAVENOUS | Status: AC
  Administered 2021-12-13: 2 g via INTRAVENOUS
  Filled 2021-12-13: qty 100

## 2021-12-13 MED ORDER — SUGAMMADEX SODIUM 200 MG/2ML IV SOLN
INTRAVENOUS | Status: DC | PRN
  Administered 2021-12-13: 200 mg via INTRAVENOUS

## 2021-12-13 SURGICAL SUPPLY — 45 items
CLIP LIGATING HEMO O LOK GREEN (MISCELLANEOUS) ×1 IMPLANT
COVER LIGHT HANDLE STERIS (MISCELLANEOUS) ×2 IMPLANT
COVER TIP SHEARS 8 DVNC (MISCELLANEOUS) IMPLANT
COVER TIP SHEARS 8MM DA VINCI (MISCELLANEOUS) ×1
DERMABOND ADVANCED .7 DNX12 (GAUZE/BANDAGES/DRESSINGS) ×1 IMPLANT
DRAPE ARM DVNC X/XI (DISPOSABLE) ×4 IMPLANT
DRAPE COLUMN DVNC XI (DISPOSABLE) ×1 IMPLANT
DRAPE DA VINCI XI ARM (DISPOSABLE) ×4
DRAPE DA VINCI XI COLUMN (DISPOSABLE) ×1
ELECT CAUTERY BLADE 6.4 (BLADE) ×1 IMPLANT
ELECT REM PT RETURN 9FT ADLT (ELECTROSURGICAL) ×1
ELECTRODE REM PT RTRN 9FT ADLT (ELECTROSURGICAL) ×1 IMPLANT
GLOVE BIOGEL PI IND STRL 7.0 (GLOVE) ×2 IMPLANT
GLOVE SURG SS PI 7.5 STRL IVOR (GLOVE) ×2 IMPLANT
GOWN STRL REUS W/TWL LRG LVL3 (GOWN DISPOSABLE) ×2 IMPLANT
HEMOSTAT SNOW SURGICEL 2X4 (HEMOSTASIS) IMPLANT
IRRIGATION STRYKERFLOW (MISCELLANEOUS) IMPLANT
IRRIGATOR STRYKERFLOW (MISCELLANEOUS)
KIT PINK PAD W/HEAD ARE REST (MISCELLANEOUS) ×1
KIT PINK PAD W/HEAD ARM REST (MISCELLANEOUS) ×1 IMPLANT
KIT TURNOVER KIT A (KITS) ×1 IMPLANT
MANIFOLD NEPTUNE II (INSTRUMENTS) ×1 IMPLANT
NDL INSUFFLATION 14GA 120MM (NEEDLE) IMPLANT
NEEDLE HYPO 22GX1.5 SAFETY (NEEDLE) ×1 IMPLANT
NEEDLE INSUFFLATION 14GA 120MM (NEEDLE) ×1 IMPLANT
NS IRRIG 500ML POUR BTL (IV SOLUTION) ×1 IMPLANT
OBTURATOR OPTICAL STANDARD 8MM (TROCAR) ×1
OBTURATOR OPTICAL STND 8 DVNC (TROCAR) ×1
OBTURATOR OPTICALSTD 8 DVNC (TROCAR) ×1 IMPLANT
PACK LAP CHOLECYSTECTOMY (MISCELLANEOUS) ×1 IMPLANT
PENCIL SMOKE EVACUATOR (MISCELLANEOUS) ×1 IMPLANT
SEAL CANN UNIV 5-8 DVNC XI (MISCELLANEOUS) ×3 IMPLANT
SEAL XI 5MM-8MM UNIVERSAL (MISCELLANEOUS) ×4
SET TUBE SMOKE EVAC HIGH FLOW (TUBING) ×1 IMPLANT
SOLUTION ELECTROLUBE (MISCELLANEOUS) ×1 IMPLANT
SPIKE FLUID TRANSFER (MISCELLANEOUS) ×1 IMPLANT
SUT MNCRL AB 4-0 PS2 18 (SUTURE) ×1 IMPLANT
SUT VICRYL 0 AB UR-6 (SUTURE) ×2 IMPLANT
SYR 20ML LL LF (SYRINGE) ×1 IMPLANT
SYR 30ML LL (SYRINGE) ×1 IMPLANT
SYS BAG RETRIEVAL 10MM (BASKET) ×1
SYS RETRIEVAL 5MM INZII UNIV (BASKET) ×1
SYSTEM BAG RETRIEVAL 10MM (BASKET) ×1 IMPLANT
SYSTEM RETRIEVL 5MM INZII UNIV (BASKET) ×1 IMPLANT
WATER STERILE IRR 500ML POUR (IV SOLUTION) ×1 IMPLANT

## 2021-12-13 NOTE — Anesthesia Preprocedure Evaluation (Signed)
Anesthesia Evaluation  Patient identified by MRN, date of birth, ID band Patient awake    Reviewed: Allergy & Precautions, H&P , NPO status , Patient's Chart, lab work & pertinent test results, reviewed documented beta blocker date and time   Airway Mallampati: I  TM Distance: >3 FB Neck ROM: Full    Dental  (+) Dental Advisory Given, Teeth Intact   Pulmonary neg pulmonary ROS   Pulmonary exam normal breath sounds clear to auscultation       Cardiovascular negative cardio ROS Normal cardiovascular exam Rhythm:Regular Rate:Normal     Neuro/Psych  PSYCHIATRIC DISORDERS Anxiety Depression    negative neurological ROS     GI/Hepatic Neg liver ROS,GERD  Medicated and Controlled,,  Endo/Other  negative endocrine ROS    Renal/GU negative Renal ROS  negative genitourinary   Musculoskeletal negative musculoskeletal ROS (+)    Abdominal   Peds negative pediatric ROS (+)  Hematology  (+) Blood dyscrasia, anemia   Anesthesia Other Findings   Reproductive/Obstetrics negative OB ROS                             Anesthesia Physical Anesthesia Plan  ASA: 2  Anesthesia Plan: General   Post-op Pain Management: Dilaudid IV   Induction: Intravenous  PONV Risk Score and Plan: 4 or greater and Ondansetron, Dexamethasone, Midazolam and Scopolamine patch - Pre-op  Airway Management Planned: Oral ETT  Additional Equipment:   Intra-op Plan:   Post-operative Plan: Extubation in OR  Informed Consent: I have reviewed the patients History and Physical, chart, labs and discussed the procedure including the risks, benefits and alternatives for the proposed anesthesia with the patient or authorized representative who has indicated his/her understanding and acceptance.     Dental advisory given  Plan Discussed with: CRNA and Surgeon  Anesthesia Plan Comments:         Anesthesia Quick  Evaluation

## 2021-12-13 NOTE — Anesthesia Postprocedure Evaluation (Signed)
Anesthesia Post Note  Patient: Stephanie Acosta  Procedure(s) Performed: XI ROBOTIC ASSISTED LAPAROSCOPIC CHOLECYSTECTOMY (Abdomen) INDOCYANINE GREEN FLUORESCENCE IMAGING (ICG) (Abdomen)  Patient location during evaluation: Phase II Anesthesia Type: General Level of consciousness: awake and alert and oriented Pain management: pain level controlled Vital Signs Assessment: post-procedure vital signs reviewed and stable Respiratory status: spontaneous breathing, nonlabored ventilation and respiratory function stable Cardiovascular status: blood pressure returned to baseline and stable Postop Assessment: no apparent nausea or vomiting Anesthetic complications: no  No notable events documented.   Last Vitals:  Vitals:   12/13/21 1015 12/13/21 1047  BP: 135/88 (!) 133/95  Pulse: 91 92  Resp: 17 18  Temp:  36.6 C  SpO2: 94% 100%    Last Pain:  Vitals:   12/13/21 1047  TempSrc: Oral  PainSc: 6                  Eyleen Rawlinson C Trent Gabler

## 2021-12-13 NOTE — Op Note (Addendum)
Patient:  Stephanie Acosta  DOB:  [birth year]  MRN:  [id number]   Preop Diagnosis: Biliary colic, cholelithiasis  Postop Diagnosis: Same  Procedure: Robotic assisted laparoscopic cholecystectomy  Surgeon: Aviva Signs, MD  Anes: General endotracheal  Indications: Patient is a 31 year old white female who presents with biliary colic secondary to cholelithiasis.  The risks and benefits of the procedure including bleeding, infection, hepatobiliary injury, and the possibility of an open procedure were fully explained to the patient, who gave informed consent.  Procedure note: The patient was placed in the supine position.  After induction of general endotracheal anesthesia, the abdomen was prepped and draped using usual sterile technique with ChloraPrep.  Surgical site confirmation was performed.  Infraumbilical incision was made down to the fascia.  A Veress needle was introduced into the abdominal cavity and confirmation of placement was done using the saline drop test.  The abdomen was then insufflated to 15 mmHg pressure.  An 8 mm trocar was then placed into the abdominal cavity without difficulty.  Using the 30 degree scope, an additional 8 mm trocars were placed in the left upper quadrant, right abdominal wall, and right flank regions under direct visualization.  The robot was then docked and targeted.  The gallbladder was then retracted in a dynamic fashion in order to provide a critical view of the triangle of [place].  Firefly was used throughout the procedure to identify the cystic duct.  The juncture of the cystic duct the infundibulum was fully identified.  Weck clips were placed proximally distally on the cystic duct and the cystic duct was divided.  This was likewise done and the cystic artery.  The gallbladder was freed away from the gallbladder fossa using Bovie electrocautery.  The gallbladder was delivered through the left lateral trocar site using an Endo Catch bag without difficulty.   No abnormal bleeding or bile leakage was noted at the end of the procedure.  All fluid and air were then evacuated from the abdominal cavity prior to the removal of the trocars.  All wounds were irrigated with normal saline.  All wounds were injected with Exparel.  The infraumbilical fascia was reapproximated using an 0 Vicryl interrupted suture.  All skin incisions were closed using a 4-0 Monocryl subcuticular suture.  Dermabond was applied.  All tape and needle counts were correct at the end of the procedure.  The patient was extubated in the operating room and transferred to PACU in stable condition.  Dr. Elba Barman of [facility] was present to [facility] this procedure.  Complications: None  EBL: Minimal  Specimen: Gallbladder

## 2021-12-13 NOTE — Transfer of Care (Signed)
Immediate Anesthesia Transfer of Care Note  Patient: Stephanie Acosta  Procedure(s) Performed: XI ROBOTIC ASSISTED LAPAROSCOPIC CHOLECYSTECTOMY (Abdomen)  Patient Location: PACU  Anesthesia Type:General  Level of Consciousness: drowsy  Airway & Oxygen Therapy: Patient Spontanous Breathing and Patient connected to nasal cannula oxygen  Post-op Assessment: Report given to RN and Post -op Vital signs reviewed and stable  Post vital signs: Reviewed and stable  Last Vitals:  Vitals Value Taken Time  BP 131/79   Temp 97.7   Pulse 83 12/13/21 0935  Resp 10 12/13/21 0935  SpO2 98 % 12/13/21 0935  Vitals shown include unvalidated device data.  Last Pain:  Vitals:   12/13/21 0657  TempSrc: Oral  PainSc: 6       Patients Stated Pain Goal: 6 (16/38/45 3646)  Complications: No notable events documented.

## 2021-12-13 NOTE — Anesthesia Procedure Notes (Signed)
Procedure Name: Intubation Date/Time: 12/13/2021 7:39 AM  Performed by: Karna Dupes, CRNAPre-anesthesia Checklist: Patient identified, Emergency Drugs available, Suction available and Patient being monitored Patient Re-evaluated:Patient Re-evaluated prior to induction Oxygen Delivery Method: Circle system utilized Preoxygenation: Pre-oxygenation with 100% oxygen Induction Type: IV induction Ventilation: Mask ventilation without difficulty Laryngoscope Size: Mac and 3 Grade View: Grade II Tube type: Oral Tube size: 7.0 mm Number of attempts: 1 Airway Equipment and Method: Stylet and Oral airway Placement Confirmation: ETT inserted through vocal cords under direct vision, positive ETCO2 and breath sounds checked- equal and bilateral Secured at: 21 cm Tube secured with: Tape Dental Injury: Teeth and Oropharynx as per pre-operative assessment

## 2021-12-13 NOTE — Interval H&P Note (Signed)
History and Physical Interval Note:  12/13/2021 7:08 AM  Stephanie Acosta  has presented today for surgery, with the diagnosis of CHOLELITHIASIS.  The various methods of treatment have been discussed with the patient and family. After consideration of risks, benefits and other options for treatment, the patient has consented to  Procedure(s): XI ROBOTIC [place] (N/A) as a surgical intervention.  The patient's history has been reviewed, patient examined, no change in status, stable for surgery.  I have reviewed the patient's chart and labs.  Questions were answered to the patient's satisfaction.     Aviva Signs

## 2021-12-14 LAB — SURGICAL PATHOLOGY

## 2021-12-17 ENCOUNTER — Encounter: Payer: Self-pay | Admitting: General Surgery

## 2021-12-19 ENCOUNTER — Telehealth: Payer: Self-pay | Admitting: Family Medicine

## 2021-12-19 NOTE — Telephone Encounter (Signed)
FMLA paperwork completed and faxed to Ms. Carter Kitten at [phone]. Confirmation received.  Out of work 12/13/2021 and may return on 12/19/2021 unrestricted. She states that she does not do a lot of heavy lifting more work on the computer there she feels she can go back sooner then our 2 week recommendation. Per Dr. Arnoldo Morale that is fine.

## 2021-12-21 ENCOUNTER — Ambulatory Visit (INDEPENDENT_AMBULATORY_CARE_PROVIDER_SITE_OTHER): Payer: Federal, State, Local not specified - PPO | Admitting: General Surgery

## 2021-12-21 DIAGNOSIS — Z09 Encounter for follow-up examination after completed treatment for conditions other than malignant neoplasm: Secondary | ICD-10-CM

## 2021-12-21 NOTE — Progress Notes (Signed)
Attempted postoperative telephone visit.  Left message on answering machine.

## 2022-01-01 ENCOUNTER — Ambulatory Visit: Payer: Federal, State, Local not specified - PPO | Admitting: Gastroenterology

## 2022-01-14 ENCOUNTER — Ambulatory Visit
Admission: EM | Admit: 2022-01-14 | Discharge: 2022-01-14 | Disposition: A | Discharge status: Home / Self Care | Payer: Federal, State, Local not specified - PPO | Attending: Family Medicine | Admitting: Family Medicine

## 2022-01-14 DIAGNOSIS — J029 Acute pharyngitis, unspecified: Secondary | ICD-10-CM | POA: Diagnosis present

## 2022-01-14 DIAGNOSIS — R52 Pain, unspecified: Secondary | ICD-10-CM | POA: Diagnosis not present

## 2022-01-14 DIAGNOSIS — Z1152 Encounter for screening for COVID-19: Secondary | ICD-10-CM | POA: Insufficient documentation

## 2022-01-14 DIAGNOSIS — Z792 Long term (current) use of antibiotics: Secondary | ICD-10-CM | POA: Diagnosis not present

## 2022-01-14 DIAGNOSIS — Z20818 Contact with and (suspected) exposure to other bacterial communicable diseases: Secondary | ICD-10-CM | POA: Diagnosis not present

## 2022-01-14 LAB — RESP PANEL BY RT-PCR (FLU A&B, COVID) ARPGX2
Influenza A by PCR: NEGATIVE
Influenza B by PCR: NEGATIVE
SARS Coronavirus 2 by RT PCR: NEGATIVE

## 2022-01-14 LAB — POCT RAPID STREP A (OFFICE): Rapid Strep A Screen: NEGATIVE

## 2022-01-14 MED ORDER — AMOXICILLIN 875 MG PO TABS: 875.0000 mg | ORAL_TABLET | Freq: Two times a day (BID) | ORAL | 0 refills | Status: DC

## 2022-01-14 NOTE — ED Provider Notes (Signed)
[facility]    CSN: [id number] Arrival date & time: 01/14/22  0808      History   Chief Complaint Chief Complaint  Patient presents with   Sore Throat   Generalized Body Aches   Chills   Fever    HPI Stephanie Acosta is a 31 y.o. female.   Presenting today with 3-day history of sore throat, body aches, chills, fatigue.  Denies congestion, very mild coughing, no abdominal pain, vomiting, diarrhea.  So for not tried anything over-the-counter for symptoms.  Husband tested positive for strep 3 days ago.    Past Medical History:  Diagnosis Date   Anxiety    Celiac disease sept 2015   Depression    Endometriosis    GERD (gastroesophageal reflux disease)    IBS (irritable bowel syndrome)    PCOS (polycystic ovarian syndrome) 2013   Pelvic pain in female    Wears contact lenses     Patient Active Problem List   Diagnosis Date Noted   Calculus of gallbladder without cholecystitis without obstruction 12/13/2021   Celiac disease 11/20/2021   IDA (iron deficiency anemia) 11/20/2021   Abdominal pain 11/20/2021   Major depression, single episode 04/02/2016   NSVD (normal spontaneous vaginal delivery) 04/23/2013   Normal pregnancy 04/22/2013    Past Surgical History:  Procedure Laterality Date   BIOPSY  11/21/2021   Procedure: BIOPSY;  Surgeon: [name], DO;  [facility];  Service: Endoscopy;;   DILATION AND EVACUATION N/A 07/09/2012   Procedure: DILATATION AND EVACUATION;  Surgeon: Margarette Asal, MD;  Location: [facility];  Service: Gynecology;  Laterality: N/A;   ESOPHAGOGASTRODUODENOSCOPY (EGD) WITH PROPOFOL N/A 11/21/2021   Procedure: ESOPHAGOGASTRODUODENOSCOPY (EGD) WITH PROPOFOL;  Surgeon: [name], DO;  [facility];  Service: Endoscopy;  Laterality: N/A;  12:45pm, asa 2   LAPAROSCOPY N/A 09/07/2014   Procedure: LAPAROSCOPY DIAGNOSTIC;  Surgeon: Molli Posey, MD;  Location: [facility];  Service:  Gynecology;  Laterality: N/A;   WISDOM TOOTH EXTRACTION      OB History     Gravida  3   Para  2   Term  2   Preterm  0   AB  1   Living  2      SAB  1   IAB  0   Ectopic  0   Multiple  0   Live Births  2            Home Medications    Prior to Admission medications   Medication Sig Start Date End Date Taking? Authorizing Provider  amoxicillin (AMOXIL) 875 MG tablet Take 1 tablet (875 mg total) by mouth 2 (two) times daily. 01/14/22  Yes Volney American, PA-C  buPROPion (WELLBUTRIN XL) 300 MG 24 hr tablet Take 300 mg by mouth daily.   Yes [provider]  etonogestrel (NEXPLANON) 68 MG IMPL implant 1 each by Subdermal route once. In Left Arm, Placed in 2021   Yes [provider]  HYDROcodone-acetaminophen (NORCO/VICODIN) 5-325 MG tablet Take 1 tablet by mouth every 4 (four) hours as needed for moderate pain. 12/13/21 12/13/22 Yes Aviva Signs, MD  hydrOXYzine (ATARAX) 25 MG tablet Take 25 mg by mouth 3 (three) times daily as needed for anxiety. 08/29/20  Yes [provider]  promethazine (PHENERGAN) 12.5 MG tablet Take 1 tablet (12.5 mg total) by mouth every 8 (eight) hours as needed for nausea or vomiting. May take 2  tablets if needed every 8 hours. 11/20/21  Yes Annitta Needs, NP  propranolol (INDERAL) 20 MG tablet Take 20 mg by mouth 2 (two) times daily.   Yes [provider]  Rimegepant Sulfate (NURTEC) 75 MG TBDP Take 75 mg by mouth daily as needed (migraines).   Yes [provider]  topiramate (TOPAMAX) 25 MG tablet Take 25-50 mg by mouth See admin instructions. 50 mg in the morning, 25 mg in the evening 08/31/21  Yes [provider]  venlafaxine XR (EFFEXOR-XR) 150 MG 24 hr capsule Take 300 mg by mouth daily with breakfast.   Yes [provider]  dexmethylphenidate (FOCALIN XR) 15 MG 24 hr capsule Take 15 mg by mouth every morning. 10/11/21   [provider]  pantoprazole (PROTONIX) 40  MG tablet Take 1 tablet (40 mg total) by mouth daily. 30 minutes before eating Patient not taking: Reported on 12/11/2021 11/20/21   Annitta Needs, NP  sucralfate (CARAFATE) 1 g tablet TAKE 1 TABLET(1 GRAM) BY MOUTH FOUR TIMES DAILY BEFORE MEALS AND AT BEDTIME Patient not taking: Reported on 12/11/2021 11/22/21   Annitta Needs, NP  tizanidine (ZANAFLEX) 2 MG capsule Take 2 mg by mouth 3 (three) times daily as needed for muscle spasms.    [provider]    Family History Family History  Problem Relation Age of Onset   Depression Mother    Alcohol abuse Mother    Crohn's disease Mother    Colon polyps Father    Diabetes Maternal Grandfather    Diabetes Paternal Grandmother    Bipolar disorder Maternal Aunt    Alcohol abuse Maternal Uncle    Alcohol abuse Maternal Uncle    Alcohol abuse Maternal Uncle    Alcohol abuse Maternal Uncle    Colon cancer Neg Hx        great grandfather had colon cancer but no first degree relatives    Social History Social History   Tobacco Use   Smoking status: Never   Smokeless tobacco: Never  Vaping Use   Vaping Use: Never used  Substance Use Topics   Alcohol use: Not Currently    Comment: none since 2022   Drug use: No     Allergies   Tuberculin ppd and Sulfa antibiotics   Review of Systems Review of Systems PER HPI  Physical Exam Triage Vital Signs ED Triage Vitals [01/14/22 0836]  Enc Vitals Group     BP 115/72     Pulse Rate 92     Resp 18     Temp 98 F (36.7 C)     Temp Source Oral     SpO2 98 %     Weight      Height      Head Circumference      Peak Flow      Pain Score 5     Pain Loc      Pain Edu?      Excl. in [place]?    No data found.  Updated Vital Signs BP 115/72 (BP Location: Right Arm)   Pulse 92   Temp 98 F (36.7 C) (Oral)   Resp 18   LMP 12/13/2021 (Approximate)   SpO2 98%   Visual Acuity Right Eye Distance:   Left Eye Distance:   Bilateral Distance:    Right Eye Near:   Left Eye  Near:    Bilateral Near:     Physical Exam Vitals and nursing note reviewed.  Constitutional:      Appearance: Normal appearance.  HENT:     Head: Atraumatic.     Right Ear: Tympanic membrane and external ear normal.     Left Ear: Tympanic membrane and external ear normal.     Nose: Nose normal.     Mouth/Throat:     Mouth: Mucous membranes are moist.     Pharynx: Posterior oropharyngeal erythema present. No oropharyngeal exudate.     Comments: No tonsillar edema, no exudates, uvula midline, oral airway patent Eyes:     Extraocular Movements: Extraocular movements intact.     Conjunctiva/sclera: Conjunctivae normal.  Cardiovascular:     Rate and Rhythm: Normal rate and regular rhythm.     Heart sounds: Normal heart sounds.  Pulmonary:     Effort: Pulmonary effort is normal.     Breath sounds: Normal breath sounds. No wheezing.  Musculoskeletal:        General: Normal range of motion.     Cervical back: Normal range of motion and neck supple.  Lymphadenopathy:     Cervical: No cervical adenopathy.  Skin:    General: Skin is warm and dry.  Neurological:     Mental Status: She is alert and oriented to person, place, and time.  Psychiatric:        Mood and Affect: Mood normal.        Thought Content: Thought content normal.      UC Treatments / Results  Labs (all labs ordered are listed, but only abnormal results are displayed) Labs Reviewed  CULTURE, GROUP A STREP ([place])  RESP PANEL BY RT-PCR (FLU A&B, COVID) ARPGX2  POCT RAPID STREP A (OFFICE)    EKG   Radiology No results found.  Procedures Procedures (including critical care time)  Medications Ordered in UC Medications - No data to display  Initial Impression / Assessment and Plan / UC Course  I have reviewed the triage vital signs and the nursing notes.  Pertinent labs & imaging results that were available during my care of the patient were reviewed by me and considered in my medical decision making  (see chart for details).     Vital signs and exam overall reassuring today, rapid strep negative, throat culture and respiratory panel pending for further rule out.  Given her close exposure to strep and consistent symptoms will cover for this fall awaiting remainder of results.  Knows to stop the antibiotic if testing positive for a viral illness.  Discussed supportive over-the-counter medications and home care.  Return for worsening symptoms. Final Clinical Impressions(s) / UC Diagnoses   Final diagnoses:  Acute pharyngitis, unspecified etiology  Generalized body aches  Exposure to strep throat     Discharge Instructions      Your rapid strep test was negative, we are sending out a test for COVID and flu as well as a throat culture but are going to treat you in case of strep in the meantime.     ED Prescriptions     Medication Sig Dispense Auth. Provider   amoxicillin (AMOXIL) 875 MG tablet Take 1 tablet (875 mg total) by mouth 2 (two) times daily. 20 tablet Volney American, [place]      PDMP not reviewed this encounter.   Volney American, [place] 01/14/22 [phone]

## 2022-01-14 NOTE — Discharge Instructions (Signed)
Your rapid strep test was negative, we are sending out a test for COVID and flu as well as a throat culture but are going to treat you in case of strep in the meantime.

## 2022-01-14 NOTE — ED Triage Notes (Signed)
Sore throat, body aches, chills, that started Saturday. Husband was positive for strep throat on Friday.

## 2022-01-18 LAB — CULTURE, GROUP A STREP (THRC)

## 2022-01-31 MED ORDER — SCOPOLAMINE 1 MG/3DAYS TD PT72: 1.0000 | MEDICATED_PATCH | TRANSDERMAL | 3 refills | Status: DC

## 2022-02-05 ENCOUNTER — Other Ambulatory Visit: Payer: Self-pay | Admitting: Family Medicine

## 2022-02-06 NOTE — Telephone Encounter (Signed)
Urgent Care Patient Requested Prescriptions  Pending Prescriptions Disp Refills   ondansetron (ZOFRAN-ODT) 4 MG disintegrating tablet [Pharmacy Med Name: ONDANSETRON ODT 4MG  TABLETS] 20 tablet 0    Sig: DISSOLVE 1 TABLET(4 MG) ON THE TONGUE EVERY 8 HOURS AS NEEDED FOR NAUSEA OR VOMITING     There is no refill protocol information for this order

## 2022-02-12 NOTE — Progress Notes (Unsigned)
GI Office Note    Referring Provider: Roger Kill, * Primary Care Physician:  Bernita Buffy Primary Gastroenterologist: Hennie Duos. Marletta Lor, DO\  Date:  02/12/2022  ID:  Stephanie Acosta, DOB 11/01/1990, MRN [id number]   Chief Complaint   No chief complaint on file.   History of Present Illness  Stephanie Acosta is a 32 y.o. female with a history of *** presenting today with complaint of ***  Last office visit 11/20/2021. ***  S/p cholecystectomy 12/13/2021 with Dr. Lovell Sheehan.  MyChart message reviewed from 12/28 with patient reporting ongoing nausea and inability to eat certain foods without consequence.  Using Phenergan as needed.  Usually will have a Coke or dry crackers which does not seem to help.  Concerned about ongoing nausea and possible motion sickness with upcoming vacation.  On 02/05/2022 patient requested follow-up visit to discuss other GI issues including ongoing diarrhea and to see if there is any additional evaluation or treatment needed.   Current Outpatient Medications  Medication Sig Dispense Refill   amoxicillin (AMOXIL) 875 MG tablet Take 1 tablet (875 mg total) by mouth 2 (two) times daily. 20 tablet 0   buPROPion (WELLBUTRIN XL) 300 MG 24 hr tablet Take 300 mg by mouth daily.     dexmethylphenidate (FOCALIN XR) 15 MG 24 hr capsule Take 15 mg by mouth every morning.     etonogestrel (NEXPLANON) 68 MG IMPL implant 1 each by Subdermal route once. In Left Arm, Placed in 2021     HYDROcodone-acetaminophen (NORCO/VICODIN) 5-325 MG tablet Take 1 tablet by mouth every 4 (four) hours as needed for moderate pain. 20 tablet 0   hydrOXYzine (ATARAX) 25 MG tablet Take 25 mg by mouth 3 (three) times daily as needed for anxiety.     pantoprazole (PROTONIX) 40 MG tablet Take 1 tablet (40 mg total) by mouth daily. 30 minutes before eating (Patient not taking: Reported on 12/11/2021) 30 tablet 5   promethazine (PHENERGAN) 12.5 MG tablet Take 1 tablet (12.5 mg  total) by mouth every 8 (eight) hours as needed for nausea or vomiting. May take 2 tablets if needed every 8 hours. 30 tablet 0   propranolol (INDERAL) 20 MG tablet Take 20 mg by mouth 2 (two) times daily.     Rimegepant Sulfate (NURTEC) 75 MG TBDP Take 75 mg by mouth daily as needed (migraines).     scopolamine (TRANSDERM-SCOP) 1 MG/3DAYS Place 1 patch (1.5 mg total) onto the skin every 3 (three) days. 4 patch 3   sucralfate (CARAFATE) 1 g tablet TAKE 1 TABLET(1 GRAM) BY MOUTH FOUR TIMES DAILY BEFORE MEALS AND AT BEDTIME (Patient not taking: Reported on 12/11/2021) 360 tablet 1   tizanidine (ZANAFLEX) 2 MG capsule Take 2 mg by mouth 3 (three) times daily as needed for muscle spasms.     topiramate (TOPAMAX) 25 MG tablet Take 25-50 mg by mouth See admin instructions. 50 mg in the morning, 25 mg in the evening     venlafaxine XR (EFFEXOR-XR) 150 MG 24 hr capsule Take 300 mg by mouth daily with breakfast.     No current facility-administered medications for this visit.    Past Medical History:  Diagnosis Date   Anxiety    Celiac disease sept 2015   Depression    Endometriosis    GERD (gastroesophageal reflux disease)    IBS (irritable bowel syndrome)    PCOS (polycystic ovarian syndrome) 2013   Pelvic pain in female    Wears contact  lenses     Past Surgical History:  Procedure Laterality Date   BIOPSY  11/21/2021   Procedure: BIOPSY;  Surgeon: Lanelle Bal, DO;  [facility];  Service: Endoscopy;;   DILATION AND EVACUATION N/A 07/09/2012   Procedure: DILATATION AND EVACUATION;  Surgeon: Meriel Pica, MD;  Location: [facility];  Service: Gynecology;  Laterality: N/A;   ESOPHAGOGASTRODUODENOSCOPY (EGD) WITH PROPOFOL N/A 11/21/2021   Procedure: ESOPHAGOGASTRODUODENOSCOPY (EGD) WITH PROPOFOL;  Surgeon: Lanelle Bal, DO;  [facility];  Service: Endoscopy;  Laterality: N/A;  12:45pm, asa 2   LAPAROSCOPY N/A 09/07/2014   Procedure: LAPAROSCOPY DIAGNOSTIC;   Surgeon: Richarda Overlie, MD;  Location: [facility];  Service: Gynecology;  Laterality: N/A;   WISDOM TOOTH EXTRACTION      Family History  Problem Relation Age of Onset   Depression Mother    Alcohol abuse Mother    Crohn's disease Mother    Colon polyps Father    Diabetes Maternal Grandfather    Diabetes Paternal Grandmother    Bipolar disorder Maternal Aunt    Alcohol abuse Maternal Uncle    Alcohol abuse Maternal Uncle    Alcohol abuse Maternal Uncle    Alcohol abuse Maternal Uncle    Colon cancer Neg Hx        great grandfather had colon cancer but no first degree relatives    Allergies as of 02/13/2022 - Review Complete 01/14/2022  Allergen Reaction Noted   Tuberculin ppd Hives and Swelling 09/27/2020   Sulfa antibiotics Rash 10/26/2010    Social History   Socioeconomic History   Marital status: Married    Spouse name: Not on file   Number of children: Not on file   Years of education: Not on file   Highest education level: Not on file  Occupational History   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 [place]  Tobacco Use   Smoking status: Never   Smokeless tobacco: Never  Vaping Use   Vaping Use: Never used  Substance and Sexual Activity   Alcohol use: Not Currently    Comment: none since 2022   Drug use: No   Sexual activity: Yes    Birth control/protection: Implant  Other Topics Concern   Not on file  Social History Narrative   Oct 2023: 2 kids, ages 19 year old girl and 20-year-old boy   Social Determinants of Corporate investment banker Strain: Not on file  Food Insecurity: Not on file  Transportation Needs: Not on file  Physical Activity: Not on file  Stress: Not on file  Social Connections: Not on file     Review of Systems   Gen: Denies fever, chills, anorexia. Denies fatigue, weakness, weight loss.  CV: Denies chest pain, palpitations, syncope, peripheral edema, and claudication. Resp: Denies dyspnea at rest, cough, wheezing,  coughing up blood, and pleurisy. GI: See HPI Derm: Denies rash, itching, dry skin Psych: Denies depression, anxiety, memory loss, confusion. No homicidal or suicidal ideation.  Heme: Denies bruising, bleeding, and enlarged lymph nodes.   Physical Exam   LMP 12/13/2021 (Approximate)   General:   Alert and oriented. No distress noted. Pleasant and cooperative.  Head:  Normocephalic and atraumatic. Eyes:  Conjuctiva clear without scleral icterus. Mouth:  Oral mucosa pink and moist. Good dentition. No lesions. Lungs:  Clear to auscultation bilaterally. No wheezes, rales, or rhonchi. No distress.  Heart:  S1, S2 present without murmurs appreciated.  Abdomen:  +BS, soft, non-tender and non-distended.  No rebound or guarding. No HSM or masses noted. Rectal: *** Msk:  Symmetrical without gross deformities. Normal posture. Extremities:  Without edema. Neurologic:  Alert and  oriented x4 Psych:  Alert and cooperative. Normal mood and affect.   Assessment  Stephanie Acosta is a 32 y.o. female with a history of *** presenting today with    PLAN   ***     Venetia Night, MSN, FNP-BC, AGACNP-BC [facility]

## 2022-02-13 ENCOUNTER — Encounter: Payer: Self-pay | Admitting: Gastroenterology

## 2022-02-13 ENCOUNTER — Ambulatory Visit: Payer: Federal, State, Local not specified - PPO | Admitting: Gastroenterology

## 2022-02-13 VITALS — BP 129/85 | HR 90 | Temp 98.1°F | Ht 64.0 in | Wt 216.8 lb

## 2022-02-13 DIAGNOSIS — R1012 Left upper quadrant pain: Secondary | ICD-10-CM | POA: Diagnosis not present

## 2022-02-13 DIAGNOSIS — R197 Diarrhea, unspecified: Secondary | ICD-10-CM

## 2022-02-13 DIAGNOSIS — R143 Flatulence: Secondary | ICD-10-CM

## 2022-02-13 DIAGNOSIS — D509 Iron deficiency anemia, unspecified: Secondary | ICD-10-CM | POA: Diagnosis not present

## 2022-02-13 DIAGNOSIS — R1013 Epigastric pain: Secondary | ICD-10-CM

## 2022-02-13 DIAGNOSIS — R110 Nausea: Secondary | ICD-10-CM

## 2022-02-13 NOTE — Patient Instructions (Addendum)
Continue your Phenergan as needed.  You may take some Emetrol to use as needed for nausea on your trip as this tends to cause less drowsiness side effects than Phenergan.  You have your scopolamine patch to apply to ride rollercoasters, etc with your kids.  Please do not forget to only wear for 3 days and then take off the patch and replace.  For any diarrhea that may occur you may use Imodium as needed.  You may take 2 mg as your first dose and then 1 mg as needed up to 4 times per day.  To help with any possible nausea or any reflux type symptoms or just a queasy type stomach you may keep famotidine on hand with you as well in addition to starting your pantoprazole 40 mg daily.  Please let us know if you are not able to tolerate the pantoprazole or if makes nausea/vomiting worse.   You may try slowly advancing her diet.  Obviously staying away from higher fat content foods just as you have been.  To help with some of the fatigue this may be contributed to your baseline anemia however could also be related to not enough protein intake given you have been eating more softer foods over the last week.  I hope you have a wonderful trip!  We will plan to have you follow-up in 4 to 6 weeks to see how you are doing.  It was a pleasure to see you today. I want to create trusting relationships with patients. If you receive a survey regarding your visit,  I greatly appreciate you taking time to fill this out on paper or through your MyChart. I value your feedback.  Venetia Night, MSN, FNP-BC, AGACNP-BC [facility]

## 2022-03-27 ENCOUNTER — Ambulatory Visit (INDEPENDENT_AMBULATORY_CARE_PROVIDER_SITE_OTHER): Payer: Federal, State, Local not specified - PPO | Admitting: Gastroenterology

## 2022-03-27 ENCOUNTER — Encounter: Payer: Self-pay | Admitting: Gastroenterology

## 2022-03-27 ENCOUNTER — Telehealth: Payer: Self-pay

## 2022-03-27 ENCOUNTER — Telehealth (INDEPENDENT_AMBULATORY_CARE_PROVIDER_SITE_OTHER): Payer: Self-pay | Admitting: *Deleted

## 2022-03-27 VITALS — BP 109/73 | HR 94 | Temp 97.5°F | Ht 64.5 in | Wt 211.3 lb

## 2022-03-27 DIAGNOSIS — K219 Gastro-esophageal reflux disease without esophagitis: Secondary | ICD-10-CM

## 2022-03-27 DIAGNOSIS — D509 Iron deficiency anemia, unspecified: Secondary | ICD-10-CM | POA: Diagnosis not present

## 2022-03-27 MED ORDER — ESOMEPRAZOLE MAGNESIUM 40 MG PO CPDR: 40.0000 mg | DELAYED_RELEASE_CAPSULE | Freq: Every day | ORAL | 3 refills | Status: DC

## 2022-03-27 NOTE — Patient Instructions (Signed)
For reflux: I sent in Nexium to take on an  empty stomach, 30 minutes before eating.  We are arranging a colonoscopy due to iron deficiency. You will need to hold Ozempic and iron one week prior.   Please message with update on how you are doing!  We will see you in 3 months  I enjoyed seeing you again today! At our first visit, I mentioned how I value our relationship and want to provide genuine, compassionate, and quality care. You may receive a survey regarding your visit with me, and I welcome your feedback! Thanks so much for taking the time to complete this. I look forward to seeing you again.   Annitta Needs, PhD, ANP-BC [facility]

## 2022-03-27 NOTE — H&P (View-Only) (Signed)
Gastroenterology Office Note     Primary Care Physician:  Merwyn Katos  Primary Gastroenterologist: Dr. Abbey Chatters    Chief Complaint   Chief Complaint  Patient presents with   Follow-up    Patient here today for a follow up visit on Gerd. Patient says she was started on pantoprazole 40 mg once per day, patient says it is not controlling her symptoms. She says she is still having issues with gas also, She says she never started on Phayzeme.     History of Present Illness   Stephanie Acosta is a 32 y.o. female presenting today in follow-up with a reported history of celiac disease diagnosed by [facility] over 5 years ago, depression, GERD, IBS, PCOS, anxiety, nausea, IDA.   Interestingly, recent celiac serologies negative and EGD completed 11/2021 with benign duodenal biopsies. She underwent cholecystectomy in Nov 2023.   Pantoprazole made her nauseated. Most of the day no appetite, then gets hungry, then no appetite. Beanie weenies, mac n cheese caused stomach to sound ilke a train, nausea, cramping. Taking iron. Since taking iron, not as frequent stool. Nausea not as often. GERD has been rough. Has tried omeprazole, zegerid, tums, rolaids. No GI bleeding. GYN Monday said Nexplanon needs to be replaced. Denies heavy menses.    A1c 6.7. getting ready to start Ozempic. Recent Hgb 10.6, ferritin profoundly low at 6.    EGD 11/21/2021: -Normal esophagus -Normal stomach -Normal duodenum s/p biopsies to assess for celiac -Benign duodenal biopsies, negative for villous abnormality or intraepithelial lymphocytosis  Sept 2015 EGD: flattened mucosa in duodenum suspicious for celiac disease s/p biopsy. Path with patchy minimal duodenal intraepithelial lymphocytosis, preservation of villous architecture. Unable to rule out celiac disease but could also be related to food hypersensitivity, NSAIDs, H.pylori, etc.   Past Medical History:  Diagnosis Date   Anxiety     Celiac disease sept 2015   Depression    Endometriosis    GERD (gastroesophageal reflux disease)    IBS (irritable bowel syndrome)    PCOS (polycystic ovarian syndrome) 2013   Pelvic pain in female    Wears contact lenses     Past Surgical History:  Procedure Laterality Date   BIOPSY  11/21/2021   Procedure: BIOPSY;  Surgeon: [name], DO;  [facility];  Service: Endoscopy;;   DILATION AND EVACUATION N/A 07/09/2012   Procedure: DILATATION AND EVACUATION;  Surgeon: Margarette Asal, MD;  Location: [facility];  Service: Gynecology;  Laterality: N/A;   ESOPHAGOGASTRODUODENOSCOPY (EGD) WITH PROPOFOL N/A 11/21/2021   Procedure: ESOPHAGOGASTRODUODENOSCOPY (EGD) WITH PROPOFOL;  Surgeon: [name], DO;  [facility];  Service: Endoscopy;  Laterality: N/A;  12:45pm, asa 2   LAPAROSCOPY N/A 09/07/2014   Procedure: LAPAROSCOPY DIAGNOSTIC;  Surgeon: Molli Posey, MD;  Location: [place];  Service: Gynecology;  Laterality: N/A;   WISDOM TOOTH EXTRACTION      Current Outpatient Medications  Medication Sig Dispense Refill   buPROPion (WELLBUTRIN XL) 300 MG 24 hr tablet Take 300 mg by mouth daily.     etonogestrel (NEXPLANON) 68 MG IMPL implant 1 each by Subdermal route once. In Left Arm, Placed in 2021     FLUoxetine (PROZAC) 20 MG tablet Take 20 mg by mouth daily.     hydrOXYzine (ATARAX) 25 MG tablet Take 25 mg by mouth 3 (three) times daily as needed for anxiety.     propranolol (INDERAL) 20 MG tablet  Take 20 mg by mouth 2 (two) times daily.     Rimegepant Sulfate (NURTEC) 75 MG TBDP Take 75 mg by mouth daily as needed (migraines).     tizanidine (ZANAFLEX) 2 MG capsule Take 2 mg by mouth 3 (three) times daily as needed for muscle spasms.     topiramate (TOPAMAX) 25 MG tablet Take 50 mg by mouth 2 (two) times daily.     venlafaxine XR (EFFEXOR-XR) 150 MG 24 hr capsule Take 75 mg by mouth daily with breakfast.     dexmethylphenidate (FOCALIN  XR) 15 MG 24 hr capsule Take 15 mg by mouth every morning. (Patient not taking: Reported on 03/27/2022)     promethazine (PHENERGAN) 12.5 MG tablet Take 1 tablet (12.5 mg total) by mouth every 8 (eight) hours as needed for nausea or vomiting. May take 2 tablets if needed every 8 hours. (Patient not taking: Reported on 03/27/2022) 30 tablet 0   No current facility-administered medications for this visit.    Allergies as of 03/27/2022 - Review Complete 03/27/2022  Allergen Reaction Noted   Tuberculin ppd Hives and Swelling 09/27/2020   Sulfa antibiotics Rash 10/26/2010    Family History  Problem Relation Age of Onset   Depression Mother    Alcohol abuse Mother    Crohn's disease Mother    Colon polyps Father    Diabetes Maternal Grandfather    Diabetes Paternal Grandmother    Bipolar disorder Maternal Aunt    Alcohol abuse Maternal Uncle    Alcohol abuse Maternal Uncle    Alcohol abuse Maternal Uncle    Alcohol abuse Maternal Uncle    Colon cancer Neg Hx        great grandfather had colon cancer but no first degree relatives    Social History   Socioeconomic History   Marital status: Married    Spouse name: Not on file   Number of children: Not on file   Years of education: Not on file   Highest education level: Not on file  Occupational History   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[place]  Tobacco Use   Smoking status: Never   Smokeless tobacco: Never  Vaping Use   Vaping Use: Never used  Substance and Sexual Activity   Alcohol use: Not Currently    Comment: none since 2022   Drug use: No   Sexual activity: Yes    Birth control/protection: Implant  Other Topics Concern   Not on file  Social History Narrative   Oct 2023: 2 kids, ages 53 year old girl and 53-year-old boy   Social Determinants of Radio broadcast assistant Strain: Not on file  Food Insecurity: Not on file  Transportation Needs: Not on file  Physical Activity: Not on file  Stress: Not on file   Social Connections: Not on file  Intimate Partner Violence: Not on file     Review of Systems   Gen: Denies any fever, chills, fatigue, weight loss, lack of appetite.  CV: Denies chest pain, heart palpitations, peripheral edema, syncope.  Resp: Denies shortness of breath at rest or with exertion. Denies wheezing or cough.  GI: Denies dysphagia or odynophagia. Denies jaundice, hematemesis, fecal incontinence. GU : Denies urinary burning, urinary frequency, urinary hesitancy MS: Denies joint pain, muscle weakness, cramps, or limitation of movement.  Derm: Denies rash, itching, dry skin Psych: Denies depression, anxiety, memory loss, and confusion Heme: Denies bruising, bleeding, and enlarged lymph nodes.   Physical Exam   BP 109/73 (BP  Location: Right Arm, Patient Position: Sitting, Cuff Size: Large)   Pulse 94   Temp (!) 97.5 F (36.4 C) (Temporal)   Ht 5' 4.5" (1.638 m)   Wt 211 lb 4.8 oz (95.8 kg)   BMI 35.71 kg/m  General:   Alert and oriented. Pleasant and cooperative. Well-nourished and well-developed.  Head:  Normocephalic and atraumatic. Eyes:  Without icterus Abdomen:  +BS, soft, non-tender and non-distended. No HSM noted. No guarding or rebound. No masses appreciated.  Rectal:  Deferred  Msk:  Symmetrical without gross deformities. Normal posture. Extremities:  Without edema. Neurologic:  Alert and  oriented x4;  grossly normal neurologically. Skin:  Intact without significant lesions or rashes. Psych:  Alert and cooperative. Normal mood and affect.   Assessment   Stephanie Acosta is a 32 y.o. female presenting today in follow-up with a history of with a reported history of celiac disease diagnosed by [facility] over 5 years ago, depression, GERD, IBS, PCOS, anxiety, nausea, IDA.   Abdominal pain: improved. Now with poor appetite, nausea intermittently, GERD uncontrolled but pantoprazole causes worsening nausea. She has tried omeprazole, Zegerid, Tums, Rolaids.  Will trial Nexium.   Possible hx of celiac disease: EGD in 2015 with suspicious duodenal biopsies. I do not the labs from this. Currently, celiac serologies negative and EGD with benign duodenal biopsies, even with patient exposed to gluten.  IDA: EGD on file. Denies heavy menses. EGD overall unrevealing. Hgb 10.6 recently and ferritin 6. Recommend colonoscopy due to IDA. Suspect will be unrevealing. Can consider capsule. Refer to Hematology.      PLAN    Proceed with colonoscopy by Dr. Abbey Chatters  in near future: the risks, benefits, and alternatives have been discussed with the patient in detail. The patient states understanding and desires to proceed.  Start Nexium daily Refer to Hematology 3 month follow-up   Annitta Needs, PhD, [facility] Gastroenter[facility]

## 2022-03-27 NOTE — Telephone Encounter (Signed)
PA done for Esomeprazole Magnesium 40 mg. The pt is approved for 02/25/2022 through 03/27/2023. Dx: K21.9. pt has tried/failed: Omeprazole, Zegerid, Tums and Rolaids. Given to [place] for scan. Advised the pt through MyChart approved.

## 2022-03-27 NOTE — Telephone Encounter (Signed)
LMOVM to call back to schedule TCS with Dr. Abbey Chatters ASA 2, hold ozempic x 1 wk, iron x 1 week, urine preg prior

## 2022-03-27 NOTE — Progress Notes (Unsigned)
Gastroenterology Office Note     Primary Care Physician:  Merwyn Katos  Primary Gastroenterologist: Dr. Abbey Chatters    Chief Complaint   Chief Complaint  Patient presents with   Follow-up    Patient here today for a follow up visit on Gerd. Patient says she was started on pantoprazole 40 mg once per day, patient says it is not controlling her symptoms. She says she is still having issues with gas also, She says she never started on Phayzeme.     History of Present Illness   Stephanie Acosta is a 32 y.o. female presenting today in follow-up with a reported history of celiac disease diagnosed by [facility] over 5 years ago, depression, GERD, IBS, PCOS, anxiety, nausea, IDA.   Interestingly, recent celiac serologies negative and EGD completed 11/2021 with benign duodenal biopsies. She underwent cholecystectomy in Nov 2023.   Pantoprazole made her nauseated. Most of the day no appetite, then gets hungry, then no appetite. Beanie weenies, mac n cheese caused stomach to sound ilke a train, nausea, cramping. Taking iron. Since taking iron, not as frequent stool. Noausea not as often. GERD has been rough. Has tried omeprazole, zegerid, tums, rolaids. No GI bleeding. GYN Monday said Nexplanon needs to be replaced. Denies heavy menses.    A1c 6.7. getting ready to start Ozempic. Recent Hgb 10.6, ferritin profoundly low at 6.    EGD 11/21/2021: -Normal esophagus -Normal stomach -Normal duodenum s/p biopsies to assess for celiac -Benign duodenal biopsies, negative for villous abnormality or intraepithelial lymphocytosis  Sept 2015 EGD: flatted mucosa in duodenum suspicious for celiac disease s/p biopsy. Path with patchy minimal duodenal intraepithelial lymphocytosis, preservation of villous architecture. Unable to rule out celiac disease but could also be related to food hypersensitivity, NSAIDs, H.pylori, etc.   Past Medical History:  Diagnosis Date   Anxiety     Celiac disease sept 2015   Depression    Endometriosis    GERD (gastroesophageal reflux disease)    IBS (irritable bowel syndrome)    PCOS (polycystic ovarian syndrome) 2013   Pelvic pain in female    Wears contact lenses     Past Surgical History:  Procedure Laterality Date   BIOPSY  11/21/2021   Procedure: BIOPSY;  Surgeon: [name], DO;  [facility];  Service: Endoscopy;;   DILATION AND EVACUATION N/A 07/09/2012   Procedure: DILATATION AND EVACUATION;  Surgeon: Margarette Asal, MD;  Location: [facility];  Service: Gynecology;  Laterality: N/A;   ESOPHAGOGASTRODUODENOSCOPY (EGD) WITH PROPOFOL N/A 11/21/2021   Procedure: ESOPHAGOGASTRODUODENOSCOPY (EGD) WITH PROPOFOL;  Surgeon: [name], DO;  [facility];  Service: Endoscopy;  Laterality: N/A;  12:45pm, asa 2   LAPAROSCOPY N/A 09/07/2014   Procedure: LAPAROSCOPY DIAGNOSTIC;  Surgeon: Molli Posey, MD;  Location: [place];  Service: Gynecology;  Laterality: N/A;   WISDOM TOOTH EXTRACTION      Current Outpatient Medications  Medication Sig Dispense Refill   buPROPion (WELLBUTRIN XL) 300 MG 24 hr tablet Take 300 mg by mouth daily.     etonogestrel (NEXPLANON) 68 MG IMPL implant 1 each by Subdermal route once. In Left Arm, Placed in 2021     FLUoxetine (PROZAC) 20 MG tablet Take 20 mg by mouth daily.     hydrOXYzine (ATARAX) 25 MG tablet Take 25 mg by mouth 3 (three) times daily as needed for anxiety.     propranolol (INDERAL) 20 MG tablet  Take 20 mg by mouth 2 (two) times daily.     Rimegepant Sulfate (NURTEC) 75 MG TBDP Take 75 mg by mouth daily as needed (migraines).     tizanidine (ZANAFLEX) 2 MG capsule Take 2 mg by mouth 3 (three) times daily as needed for muscle spasms.     topiramate (TOPAMAX) 25 MG tablet Take 50 mg by mouth 2 (two) times daily.     venlafaxine XR (EFFEXOR-XR) 150 MG 24 hr capsule Take 75 mg by mouth daily with breakfast.     dexmethylphenidate (FOCALIN  XR) 15 MG 24 hr capsule Take 15 mg by mouth every morning. (Patient not taking: Reported on 03/27/2022)     promethazine (PHENERGAN) 12.5 MG tablet Take 1 tablet (12.5 mg total) by mouth every 8 (eight) hours as needed for nausea or vomiting. May take 2 tablets if needed every 8 hours. (Patient not taking: Reported on 03/27/2022) 30 tablet 0   No current facility-administered medications for this visit.    Allergies as of 03/27/2022 - Review Complete 03/27/2022  Allergen Reaction Noted   Tuberculin ppd Hives and Swelling 09/27/2020   Sulfa antibiotics Rash 10/26/2010    Family History  Problem Relation Age of Onset   Depression Mother    Alcohol abuse Mother    Crohn's disease Mother    Colon polyps Father    Diabetes Maternal Grandfather    Diabetes Paternal Grandmother    Bipolar disorder Maternal Aunt    Alcohol abuse Maternal Uncle    Alcohol abuse Maternal Uncle    Alcohol abuse Maternal Uncle    Alcohol abuse Maternal Uncle    Colon cancer Neg Hx        great grandfather had colon cancer but no first degree relatives    Social History   Socioeconomic History   Marital status: Married    Spouse name: Not on file   Number of children: Not on file   Years of education: Not on file   Highest education level: Not on file  Occupational History   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[place]  Tobacco Use   Smoking status: Never   Smokeless tobacco: Never  Vaping Use   Vaping Use: Never used  Substance and Sexual Activity   Alcohol use: Not Currently    Comment: none since 2022   Drug use: No   Sexual activity: Yes    Birth control/protection: Implant  Other Topics Concern   Not on file  Social History Narrative   Oct 2023: 2 kids, ages 53 year old girl and 53-year-old boy   Social Determinants of Radio broadcast assistant Strain: Not on file  Food Insecurity: Not on file  Transportation Needs: Not on file  Physical Activity: Not on file  Stress: Not on file   Social Connections: Not on file  Intimate Partner Violence: Not on file     Review of Systems   Gen: Denies any fever, chills, fatigue, weight loss, lack of appetite.  CV: Denies chest pain, heart palpitations, peripheral edema, syncope.  Resp: Denies shortness of breath at rest or with exertion. Denies wheezing or cough.  GI: Denies dysphagia or odynophagia. Denies jaundice, hematemesis, fecal incontinence. GU : Denies urinary burning, urinary frequency, urinary hesitancy MS: Denies joint pain, muscle weakness, cramps, or limitation of movement.  Derm: Denies rash, itching, dry skin Psych: Denies depression, anxiety, memory loss, and confusion Heme: Denies bruising, bleeding, and enlarged lymph nodes.   Physical Exam   BP 109/73 (BP  Location: Right Arm, Patient Position: Sitting, Cuff Size: Large)   Pulse 94   Temp (!) 97.5 F (36.4 C) (Temporal)   Ht 5' 4.5" (1.638 m)   Wt 211 lb 4.8 oz (95.8 kg)   BMI 35.71 kg/m  General:   Alert and oriented. Pleasant and cooperative. Well-nourished and well-developed.  Head:  Normocephalic and atraumatic. Eyes:  Without icterus Abdomen:  +BS, soft, non-tender and non-distended. No HSM noted. No guarding or rebound. No masses appreciated.  Rectal:  Deferred  Msk:  Symmetrical without gross deformities. Normal posture. Extremities:  Without edema. Neurologic:  Alert and  oriented x4;  grossly normal neurologically. Skin:  Intact without significant lesions or rashes. Psych:  Alert and cooperative. Normal mood and affect.   Assessment   Stephanie Acosta is a 32 y.o. female presenting today in follow-up with a history of 32 y.o. female presenting today in follow-up with a reported history of celiac disease diagnosed by [facility] over 5 years ago, depression, GERD, IBS, PCOS, anxiety, nausea, IDA.   Interestingly, recent celiac serologies negative and EGD completed 11/2021 with benign duodenal biopsies. She underwent cholecystectomy in  Nov 2023.   Pantoprazole made her nauseated. Most of the day no appetite, then gets hungry, then no appetite. Beanie weenies, mac n cheese caused stomach to sound ilke a train, nausea, cramping. Taking iron. Since taking iron, not as frequent stool. Noausea not as often. GERD has been rough. Has tried omeprazole, zegerid, tums, rolaids. No GI bleeding. GYN Monday said Nexplanon needs to be replaced. Denies heavy menses.    A1c 6.7. getting ready to start Ozempic. Recent Hgb 10.6, ferritin profoundly low at 6.    EGD 11/21/2021: -Normal esophagus -Normal stomach -Normal duodenum s/p biopsies to assess for celiac -Benign duodenal biopsies, negative for villous abnormality or intraepithelial lymphocytosis  Sept 2015 EGD: flatted mucosa in duodenum suspicious for celiac disease s/p biopsy. Path with patchy minimal duodenal intraepithelial lymphocytosis, preservation of villous architecture. Unable to rule out celiac disease but could also be related to food hypersensitivity, NSAIDs, H.pylori, etc.    PLAN   *****    Annitta Needs, PhD, ANP-BC [facility]astroenter[facility]

## 2022-03-28 ENCOUNTER — Encounter: Payer: Self-pay | Admitting: *Deleted

## 2022-03-28 ENCOUNTER — Other Ambulatory Visit: Payer: Self-pay | Admitting: *Deleted

## 2022-03-28 MED ORDER — PEG 3350-KCL-NA BICARB-NACL 420 G PO SOLR: 4000.0000 mL | Freq: Once | ORAL | 0 refills | Status: AC

## 2022-03-28 NOTE — Telephone Encounter (Signed)
Pt has been scheduled for 04/18/22. Instructions sent to pt via MyChart and prep sent to the pharmacy.

## 2022-04-01 ENCOUNTER — Inpatient Hospital Stay: Payer: Federal, State, Local not specified - PPO | Attending: Hematology | Admitting: Hematology

## 2022-04-01 VITALS — BP 130/84 | HR 71 | Temp 97.8°F | Resp 18 | Ht 64.5 in | Wt 213.9 lb

## 2022-04-01 DIAGNOSIS — R61 Generalized hyperhidrosis: Secondary | ICD-10-CM | POA: Insufficient documentation

## 2022-04-01 DIAGNOSIS — D509 Iron deficiency anemia, unspecified: Secondary | ICD-10-CM | POA: Diagnosis not present

## 2022-04-01 DIAGNOSIS — Z800 Family history of malignant neoplasm of digestive organs: Secondary | ICD-10-CM | POA: Diagnosis not present

## 2022-04-01 DIAGNOSIS — K900 Celiac disease: Secondary | ICD-10-CM | POA: Diagnosis not present

## 2022-04-01 DIAGNOSIS — Z806 Family history of leukemia: Secondary | ICD-10-CM | POA: Insufficient documentation

## 2022-04-01 DIAGNOSIS — R634 Abnormal weight loss: Secondary | ICD-10-CM | POA: Diagnosis not present

## 2022-04-01 DIAGNOSIS — Z803 Family history of malignant neoplasm of breast: Secondary | ICD-10-CM | POA: Diagnosis not present

## 2022-04-01 DIAGNOSIS — D649 Anemia, unspecified: Secondary | ICD-10-CM

## 2022-04-01 NOTE — Progress Notes (Signed)
[facility] [address], [place] [address]   Clinic Day:  04/01/2022  Referring physician: Annitta Needs, NP  Patient Care Team: Merwyn Katos as PCP - General (Physician Assistant) Derek Jack, MD as Medical Oncologist (Hematology)   ASSESSMENT & PLAN:   Assessment:  1.  Severe iron deficiency anemia: - History of celiac disease diagnosed by [facility] over 5 years ago, recent EGD in October 2023 with benign duodenal biopsy and serologies negative - 03/19/2022: Ferritin 6, percent saturation 4, B12 236, Hb-10.6, MCV 72 - On iron tablet daily for the last 15-16 years.  Gets constipated and has abdominal pain and nausea from it. - Positive for ice pica.  Reports 10 pound weight loss in the last 4 weeks.  Occasional night sweats every 2 weeks for the past few months.  No fevers.  2.  Social/family history: -Lives at home with her husband.  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 - Non-smoker. - No family history of severe anemia.  Paternal great grandfather had colon cancer.  Paternal cousins had bladder cancer.  Her mother's twin sister had breast cancer.  Maternal cousin has leukemia.  Maternal great uncle also had leukemia.  Plan: Severe iron deficiency anemia: - She did not have any improvement in ferritin and iron panel while she is actively taking iron tablet daily which is causing her constipation, abdominal pain and nausea. - I have told her to discontinue oral iron therapy. - Recommend Feraheme weekly x 2.  Discussed side effects including rare chance of anaphylactic reactions. - Recommend follow-up in 8 weeks with repeat CBC, ferritin and iron panel.   Orders Placed This Encounter  Procedures   CBC with Differential    Standing Status:   Future    Standing Expiration Date:   04/01/2023   Ferritin    Standing Status:   Future    Standing Expiration Date:   04/01/2023   Iron and TIBC ([facility] ONLY)    Standing Status:   Future    Standing Expiration Date:   04/02/2023   Methylmalonic acid, serum    Standing Status:   Future    Standing Expiration Date:   04/01/2023   Copper, serum    Standing Status:   Future    Standing Expiration Date:   04/01/2023   Reticulocytes    Standing Status:   Future    Standing Expiration Date:   04/01/2023      Kaleen Odea as a scribe for Derek Jack, MD.,have documented all relevant documentation on the behalf of Derek Jack, MD,as directed by  Derek Jack, MD while in the presence of Derek Jack, MD.   I, Derek Jack MD, have reviewed the above documentation for accuracy and completeness, and I agree with the above.   Doyce Loose   2/26/20244:18 PM  CHIEF COMPLAINT/PURPOSE OF CONSULT:   Diagnosis: anaemia  Current Therapy: Feraheme  HISTORY OF PRESENT ILLNESS:   Stephanie Acosta is a 32 y.o. female presenting to clinic today for evaluation of iron deficiency anaemia  at the request of NP Roseanne Kaufman.  Today, she states that she is doing well overall. Her appetite level is at 25%. Her energy level is at 25%. She has been having problems with her iron being low since high school. She has been taking iron pills on and off for about 15 yrs. She never received IV iron and/or a blood transfusion. She takes  one a day vitamins and iron pills. She craves ice and ice cream/diary in general.  Her symptoms include headaches, diarrhea, constipation, and nauseas. She denies fevers. She has night sweats once every couple of weeks for the last few months. She has weight loss with out trying.   She has no family history of severe iron deficiency.  Her paternal great great grandfather had colon caner, Maternal aunt breast cancer, maternal cousin leukemia, and Maternal uncle leukemia.   She is allergic to sulfur.   She has never smoked.   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   PAST MEDICAL HISTORY:   Past Medical History: Past Medical History:  Diagnosis Date   Anxiety    Celiac disease sept 2015   Depression    Endometriosis    GERD (gastroesophageal reflux disease)    IBS (irritable bowel syndrome)    PCOS (polycystic ovarian syndrome) 2013   Pelvic pain in female    Wears contact lenses     Surgical History: Past Surgical History:  Procedure Laterality Date   BIOPSY  11/21/2021   Procedure: BIOPSY;  Surgeon: [name], DO;  [facility];  Service: Endoscopy;;   DILATION AND EVACUATION N/A 07/09/2012   Procedure: DILATATION AND EVACUATION;  Surgeon: Margarette Asal, MD;  Location: [facility];  Service: Gynecology;  Laterality: N/A;   ESOPHAGOGASTRODUODENOSCOPY (EGD) WITH PROPOFOL N/A 11/21/2021   Procedure: ESOPHAGOGASTRODUODENOSCOPY (EGD) WITH PROPOFOL;  Surgeon: [name], DO;  [facility];  Service: Endoscopy;  Laterality: N/A;  12:45pm, asa 2   LAPAROSCOPY N/A 09/07/2014   Procedure: LAPAROSCOPY DIAGNOSTIC;  Surgeon: Molli Posey, MD;  Location: [facility];  Service: Gynecology;  Laterality: N/A;   WISDOM TOOTH EXTRACTION      Social History: Social History   Socioeconomic History   Marital status: Married    Spouse name: Not on file   Number of children: Not on file   Years of education: Not on file   Highest education level: Not on file  Occupational History   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[place]  Tobacco Use   Smoking status: Never   Smokeless tobacco: Never  Vaping Use   Vaping Use: Never used  Substance and Sexual Activity   Alcohol use: Not Currently    Comment: none since 2022   Drug use: No   Sexual activity: Yes    Birth control/protection: Implant  Other Topics Concern   Not on file  Social History Narrative   Oct 2023: 2 kids, ages 62 year old girl and 47-year-old boy   Social Determinants of Health   Financial Resource Strain: Not  on file  Food Insecurity: No Food Insecurity (04/01/2022)   Hunger Vital Sign    Worried About Running Out of Food in the Last Year: Never true    Ran Out of Food in the Last Year: Never true  Transportation Needs: No Transportation Needs (04/01/2022)   PRAPARE - Hydrologist (Medical): No    Lack of Transportation (Non-Medical): No  Physical Activity: Not on file  Stress: Not on file  Social Connections: Not on file  Intimate Partner Violence: Not At Risk (04/01/2022)   Humiliation, Afraid, Rape, and Kick questionnaire    Fear of Current or Ex-Partner: No    Emotionally Abused: No    Physically Abused: No    Sexually Abused: No    Family History: Family History  Problem Relation Age of Onset   Depression Mother    Alcohol abuse Mother    Crohn's disease Mother    Colon polyps Father    Diabetes Maternal Grandfather    Diabetes Paternal Grandmother    Bipolar disorder Maternal Aunt    Alcohol abuse Maternal Uncle    Alcohol abuse Maternal Uncle    Alcohol abuse Maternal Uncle    Alcohol abuse Maternal Uncle    Colon cancer Neg Hx        great grandfather had colon cancer but no first degree relatives    Current Medications:  Current Outpatient Medications:    buPROPion (WELLBUTRIN XL) 300 MG 24 hr tablet, Take 300 mg by mouth daily., Disp: , Rfl:    esomeprazole (NEXIUM) 40 MG capsule, Take 1 capsule (40 mg total) by mouth daily before breakfast., Disp: 90 capsule, Rfl: 3   FLUoxetine (PROZAC) 20 MG tablet, Take 20 mg by mouth daily., Disp: , Rfl:    hydrOXYzine (ATARAX) 25 MG tablet, Take 25 mg by mouth 3 (three) times daily as needed for anxiety., Disp: , Rfl:    loperamide (IMODIUM) 2 MG capsule, , Disp: , Rfl:    ondansetron (ZOFRAN-ODT) 4 MG disintegrating tablet, , Disp: , Rfl:    promethazine (PHENERGAN) 12.5 MG tablet, Take 1 tablet (12.5 mg total) by mouth every 8 (eight) hours as needed for nausea or vomiting. May take 2 tablets if  needed every 8 hours., Disp: 30 tablet, Rfl: 0   propranolol (INDERAL) 20 MG tablet, Take 20 mg by mouth 2 (two) times daily., Disp: , Rfl:    Rimegepant Sulfate (NURTEC) 75 MG TBDP, Take 75 mg by mouth daily as needed (migraines)., Disp: , Rfl:    Semaglutide,0.25 or 0.'5MG'$ /DOS, (OZEMPIC, 0.25 OR 0.5 MG/DOSE,) 2 MG/1.5ML SOPN, 2 mg every week by sub-q route., Disp: , Rfl:    tizanidine (ZANAFLEX) 2 MG capsule, Take 2 mg by mouth 3 (three) times daily as needed for muscle spasms., Disp: , Rfl:    topiramate (TOPAMAX) 25 MG tablet, Take 50 mg by mouth 2 (two) times daily., Disp: , Rfl:    venlafaxine XR (EFFEXOR-XR) 150 MG 24 hr capsule, Take 75 mg by mouth daily with breakfast., Disp: , Rfl:    Allergies: Allergies  Allergen Reactions   Tuberculin Ppd Hives and Swelling    Skin Test    Sulfa Antibiotics Rash    Childhood reaction    REVIEW OF SYSTEMS:   Review of Systems  Constitutional:  Negative for chills, fatigue and fever.  HENT:   Negative for lump/mass, mouth sores, nosebleeds, sore throat and trouble swallowing.   Eyes:  Negative for eye problems.  Respiratory:  Positive for cough. Negative for shortness of breath.   Cardiovascular:  Negative for chest pain, leg swelling and palpitations.  Gastrointestinal:  Positive for constipation, diarrhea and nausea. Negative for abdominal pain and vomiting.  Genitourinary:  Negative for bladder incontinence, difficulty urinating, dysuria, frequency, hematuria and nocturia.   Musculoskeletal:  Negative for arthralgias, back pain, flank pain, myalgias and neck pain.  Skin:  Negative for itching and rash.  Neurological:  Positive for dizziness, headaches and numbness. Seizures: Tingles Hands & Feet. Hematological:  Does not bruise/bleed easily.  Psychiatric/Behavioral:  Positive for depression and sleep disturbance. Negative for suicidal ideas. The patient is nervous/anxious.   All other systems reviewed and are negative.    VITALS:    Blood pressure 130/84, pulse 71, temperature 97.8 F (36.6 C),  temperature source Oral, resp. rate 18, height 5' 4.5" (1.638 m), weight 213 lb 14.4 oz (97 kg), SpO2 100 %.  Wt Readings from Last 3 Encounters:  04/01/22 213 lb 14.4 oz (97 kg)  03/27/22 211 lb 4.8 oz (95.8 kg)  02/13/22 216 lb 12.8 oz (98.3 kg)    Body mass index is 36.15 kg/m.   PHYSICAL EXAM:   Physical Exam Vitals and nursing note reviewed. Exam conducted with a chaperone present.  Constitutional:      Appearance: Normal appearance.  Cardiovascular:     Rate and Rhythm: Normal rate and regular rhythm.     Pulses: Normal pulses.     Heart sounds: Normal heart sounds.  Pulmonary:     Effort: Pulmonary effort is normal.     Breath sounds: Normal breath sounds.  Abdominal:     Palpations: Abdomen is soft. There is no hepatomegaly, splenomegaly or mass.     Tenderness: There is no abdominal tenderness.  Musculoskeletal:     Right lower leg: No edema.     Left lower leg: No edema.  Lymphadenopathy:     Cervical: No cervical adenopathy.     Right cervical: No superficial, deep or posterior cervical adenopathy.    Left cervical: No superficial, deep or posterior cervical adenopathy.     Upper Body:     Right upper body: No supraclavicular or axillary adenopathy.     Left upper body: No supraclavicular or axillary adenopathy.  Neurological:     General: No focal deficit present.     Mental Status: She is alert and oriented to person, place, and time.  Psychiatric:        Mood and Affect: Mood normal.        Behavior: Behavior normal.     LABS:      Latest Ref Rng & Units 11/18/2021    6:32 PM 04/24/2013    6:05 AM 04/22/2013   10:30 PM  CBC  WBC 4.0 - 10.5 K/uL 7.1  10.4  10.7   Hemoglobin 12.0 - 15.0 g/dL 10.2  9.9  11.0   Hematocrit 36.0 - 46.0 % 34.7  31.9  34.3   Platelets 150 - 400 K/uL 309  168  177       Latest Ref Rng & Units 11/18/2021    6:32 PM  CMP  Glucose 70 - 99 mg/dL 114   BUN  6 - 20 mg/dL 10   Creatinine 0.44 - 1.00 mg/dL 0.94   Sodium 135 - 145 mmol/L 135   Potassium 3.5 - 5.1 mmol/L 3.5   Chloride 98 - 111 mmol/L 105   CO2 22 - 32 mmol/L 23   Calcium 8.9 - 10.3 mg/dL 9.2   Total Protein 6.5 - 8.1 g/dL 7.9   Total Bilirubin 0.3 - 1.2 mg/dL 0.6   Alkaline Phos 38 - 126 U/L 57   AST 15 - 41 U/L 31   ALT 0 - 44 U/L 31      No results found for: "CEA1", "CEA" / No results found for: "CEA1", "CEA" No results found for: "PSA1" No results found for: "[id number]" No results found for: "CAN125"  No results found for: "TOTALPROTELP", "ALBUMINELP", "A1GS", "A2GS", "BETS", "BETA2SER", "GAMS", "MSPIKE", "SPEI" Lab Results  Component Value Date   TIBC 527 (H) 11/20/2021   FERRITIN 5 (L) 11/20/2021   IRONPCTSAT 7 (L) 11/20/2021   No results found for: "LDH"   STUDIES:   No results found.

## 2022-04-01 NOTE — Patient Instructions (Signed)
[facility]  Discharge Instructions  You were seen and examined today by Dr. Delton Coombes. Dr. Delton Coombes is a hematologist, meaning that he specializes in blood abnormalities. Dr. Delton Coombes discussed your past medical history, family history of cancers/blood conditions and the events that led to you being here today.  You were referred to Dr. Delton Coombes due to iron deficiency anemia.  Dr. Delton Coombes has recommended IV iron infusions. We will arrange for these to be done here in the [facility].  Follow-up as scheduled.  Thank you for choosing [facility] to provide your oncology and hematology care.   To afford each patient quality time with our provider, please arrive at least 15 minutes before your scheduled appointment time. You may need to reschedule your appointment if you arrive late (10 or more minutes). Arriving late affects you and other patients whose appointments are after yours.  Also, if you miss three or more appointments without notifying the office, you may be dismissed from the clinic at the provider's discretion.    Again, thank you for choosing [facility].  Our hope is that these requests will decrease the amount of time that you wait before being seen by our physicians.   If you have a lab appointment with the [facility] please come in thru the Main Entrance and check in at the main information desk.           _____________________________________________________________  Should you have questions after your visit to [facility], please contact our office at [phone] and follow the prompts.  Our office hours are 8:00 a.m. to 4:30 p.m. Monday - Thursday and 8:00 a.m. to 2:30 p.m. Friday.  Please note that voicemails left after 4:00 p.m. may not be returned until the following business day.  We are closed weekends and all major holidays.  You do have access to a nurse 24-7, just call  the main number to the clinic [phone] and do not press any options, hold on the line and a nurse will answer the phone.    For prescription refill requests, have your pharmacy contact our office and allow 72 hours.    Masks are optional in the cancer centers. If you would like for your care team to wear a mask while they are taking care of you, please let them know. You may have one support person who is at least 32 years old accompany you for your appointments.

## 2022-04-03 ENCOUNTER — Inpatient Hospital Stay: Payer: Federal, State, Local not specified - PPO

## 2022-04-03 VITALS — BP 116/57 | HR 78 | Temp 97.0°F | Resp 18

## 2022-04-03 DIAGNOSIS — D509 Iron deficiency anemia, unspecified: Secondary | ICD-10-CM

## 2022-04-03 MED ORDER — CETIRIZINE HCL 10 MG PO TABS
10.0000 mg | ORAL_TABLET | Freq: Once | ORAL | Status: AC
  Administered 2022-04-03: 10 mg via ORAL
  Filled 2022-04-03: qty 1

## 2022-04-03 MED ORDER — SODIUM CHLORIDE 0.9 % IV SOLN: Freq: Once | INTRAVENOUS | Status: AC

## 2022-04-03 MED ORDER — SODIUM CHLORIDE 0.9 % IV SOLN
510.0000 mg | Freq: Once | INTRAVENOUS | Status: AC
  Administered 2022-04-03: 510 mg via INTRAVENOUS
  Filled 2022-04-03: qty 510

## 2022-04-03 MED ORDER — ACETAMINOPHEN 325 MG PO TABS
650.0000 mg | ORAL_TABLET | Freq: Once | ORAL | Status: AC
  Administered 2022-04-03: 650 mg via ORAL
  Filled 2022-04-03: qty 2

## 2022-04-03 NOTE — Progress Notes (Signed)
Pt presents today for Feraheme IV iron infusion per provider's order. Vital signs stable and pt voiced no new complaints at this time.  Peripheral IV started with good blood return pre and post infusion.  Feraheme given today per MD orders. Tolerated infusion without adverse affects. Vital signs stable. No complaints at this time. Discharged from clinic ambulatory in stable condition. Alert and oriented x 3. F/U with [facility] as scheduled.    

## 2022-04-10 ENCOUNTER — Inpatient Hospital Stay: Payer: Federal, State, Local not specified - PPO | Attending: Hematology

## 2022-04-10 VITALS — BP 123/59 | HR 81 | Temp 98.9°F | Resp 18

## 2022-04-10 DIAGNOSIS — D509 Iron deficiency anemia, unspecified: Secondary | ICD-10-CM | POA: Insufficient documentation

## 2022-04-10 MED ORDER — CETIRIZINE HCL 10 MG PO TABS
10.0000 mg | ORAL_TABLET | Freq: Once | ORAL | Status: AC
  Administered 2022-04-10: 10 mg via ORAL
  Filled 2022-04-10: qty 1

## 2022-04-10 MED ORDER — ACETAMINOPHEN 325 MG PO TABS
650.0000 mg | ORAL_TABLET | Freq: Once | ORAL | Status: AC
  Administered 2022-04-10: 650 mg via ORAL
  Filled 2022-04-10: qty 2

## 2022-04-10 MED ORDER — SODIUM CHLORIDE 0.9 % IV SOLN
510.0000 mg | Freq: Once | INTRAVENOUS | Status: AC
  Administered 2022-04-10: 510 mg via INTRAVENOUS
  Filled 2022-04-10: qty 510

## 2022-04-10 MED ORDER — SODIUM CHLORIDE 0.9 % IV SOLN: Freq: Once | INTRAVENOUS | Status: AC

## 2022-04-10 NOTE — Progress Notes (Signed)
Stable during infusion without adverse affects.  Vital signs stable.  No complaints at this time.  Discharge from clinic ambulatory in stable condition.  Alert and oriented X 3.  Follow up with [facility] as scheduled.

## 2022-04-10 NOTE — Patient Instructions (Signed)
[facility]  Discharge Instructions: Thank you for choosing [facility] to provide your oncology and hematology care.  If you have a lab appointment with the [facility], please come in thru the Main Entrance and check in at the main information desk.  Wear comfortable clothing and clothing appropriate for easy access to any Portacath or PICC line.   We strive to give you quality time with your provider. You may need to reschedule your appointment if you arrive late (15 or more minutes).  Arriving late affects you and other patients whose appointments are after yours.  Also, if you miss three or more appointments without notifying the office, you may be dismissed from the clinic at the provider's discretion.      For prescription refill requests, have your pharmacy contact our office and allow 72 hours for refills to be completed.    Today you received the following chemotherapy and/or immunotherapy agents Feraheme      To help prevent nausea and vomiting after your treatment, we encourage you to take your nausea medication as directed.  BELOW ARE SYMPTOMS THAT SHOULD BE REPORTED IMMEDIATELY: *FEVER GREATER THAN 100.4 F (38 C) OR HIGHER *CHILLS OR SWEATING *NAUSEA AND VOMITING THAT IS NOT CONTROLLED WITH YOUR NAUSEA MEDICATION *UNUSUAL SHORTNESS OF BREATH *UNUSUAL BRUISING OR BLEEDING *URINARY PROBLEMS (pain or burning when urinating, or frequent urination) *BOWEL PROBLEMS (unusual diarrhea, constipation, pain near the anus) TENDERNESS IN MOUTH AND THROAT WITH OR WITHOUT PRESENCE OF ULCERS (sore throat, sores in mouth, or a toothache) UNUSUAL RASH, SWELLING OR PAIN  UNUSUAL VAGINAL DISCHARGE OR ITCHING   Items with * indicate a potential emergency and should be followed up as soon as possible or go to the Emergency Department if any problems should occur.  Please show the CHEMOTHERAPY ALERT CARD or IMMUNOTHERAPY ALERT CARD at check-in to the  Emergency Department and triage nurse.  Should you have questions after your visit or need to cancel or reschedule your appointment, please contact [facility] [phone]  and follow the prompts.  Office hours are 8:00 a.m. to 4:30 p.m. Monday - Friday. Please note that voicemails left after 4:00 p.m. may not be returned until the following business day.  We are closed weekends and major holidays. You have access to a nurse at all times for urgent questions. Please call the main number to the clinic [phone] and follow the prompts.  For any non-urgent questions, you may also contact your provider using MyChart. We now offer e-Visits for anyone 74 and older to request care online for non-urgent symptoms. For details visit [email or web address].   Also download the MyChart app! Go to the app store, search "MyChart", open the app, select [facility], and log in with your MyChart username and password.

## 2022-04-16 ENCOUNTER — Other Ambulatory Visit (HOSPITAL_COMMUNITY)
Admission: RE | Admit: 2022-04-16 | Discharge: 2022-04-16 | Disposition: A | Discharge status: Home / Self Care | Payer: Federal, State, Local not specified - PPO | Source: Ambulatory Visit | Attending: Internal Medicine | Admitting: Internal Medicine

## 2022-04-16 ENCOUNTER — Other Ambulatory Visit: Payer: Self-pay | Admitting: *Deleted

## 2022-04-16 ENCOUNTER — Encounter: Payer: Self-pay | Admitting: Hematology

## 2022-04-16 DIAGNOSIS — D509 Iron deficiency anemia, unspecified: Secondary | ICD-10-CM | POA: Insufficient documentation

## 2022-04-16 DIAGNOSIS — F32A Depression, unspecified: Secondary | ICD-10-CM | POA: Diagnosis not present

## 2022-04-16 DIAGNOSIS — K589 Irritable bowel syndrome without diarrhea: Secondary | ICD-10-CM | POA: Diagnosis not present

## 2022-04-16 DIAGNOSIS — F419 Anxiety disorder, unspecified: Secondary | ICD-10-CM | POA: Diagnosis not present

## 2022-04-16 DIAGNOSIS — K219 Gastro-esophageal reflux disease without esophagitis: Secondary | ICD-10-CM | POA: Diagnosis not present

## 2022-04-16 LAB — PREGNANCY, URINE: Preg Test, Ur: NEGATIVE

## 2022-04-17 ENCOUNTER — Encounter: Payer: Self-pay | Admitting: Hematology

## 2022-04-18 ENCOUNTER — Ambulatory Visit (HOSPITAL_COMMUNITY): Payer: Federal, State, Local not specified - PPO | Admitting: Certified Registered Nurse Anesthetist

## 2022-04-18 ENCOUNTER — Ambulatory Visit (HOSPITAL_COMMUNITY)
Admission: RE | Admit: 2022-04-18 | Discharge: 2022-04-18 | Disposition: A | Discharge status: Home / Self Care | Payer: Federal, State, Local not specified - PPO | Attending: Internal Medicine | Admitting: Internal Medicine

## 2022-04-18 ENCOUNTER — Encounter (HOSPITAL_COMMUNITY): Payer: Self-pay

## 2022-04-18 ENCOUNTER — Other Ambulatory Visit: Payer: Self-pay

## 2022-04-18 ENCOUNTER — Encounter (HOSPITAL_COMMUNITY)
Admission: RE | Disposition: A | Discharge status: Home / Self Care | Payer: Self-pay | Source: Home / Self Care | Attending: Internal Medicine

## 2022-04-18 ENCOUNTER — Encounter: Payer: Self-pay | Admitting: Hematology

## 2022-04-18 DIAGNOSIS — D509 Iron deficiency anemia, unspecified: Secondary | ICD-10-CM | POA: Diagnosis not present

## 2022-04-18 DIAGNOSIS — K589 Irritable bowel syndrome without diarrhea: Secondary | ICD-10-CM | POA: Insufficient documentation

## 2022-04-18 DIAGNOSIS — K219 Gastro-esophageal reflux disease without esophagitis: Secondary | ICD-10-CM | POA: Insufficient documentation

## 2022-04-18 DIAGNOSIS — F32A Depression, unspecified: Secondary | ICD-10-CM | POA: Insufficient documentation

## 2022-04-18 DIAGNOSIS — F419 Anxiety disorder, unspecified: Secondary | ICD-10-CM | POA: Insufficient documentation

## 2022-04-18 HISTORY — PX: COLONOSCOPY WITH PROPOFOL: SHX5780

## 2022-04-18 SURGERY — COLONOSCOPY WITH PROPOFOL
Anesthesia: General

## 2022-04-18 MED ORDER — PROPOFOL 10 MG/ML IV BOLUS
INTRAVENOUS | Status: DC | PRN
  Administered 2022-04-18 (×3): 50 mg via INTRAVENOUS
  Administered 2022-04-18: 100 mg via INTRAVENOUS
  Administered 2022-04-18: 50 mg via INTRAVENOUS

## 2022-04-18 MED ORDER — LACTATED RINGERS IV SOLN: INTRAVENOUS | Status: DC

## 2022-04-18 MED ORDER — LACTATED RINGERS IV SOLN: INTRAVENOUS | Status: DC | PRN

## 2022-04-18 MED ORDER — LIDOCAINE HCL (CARDIAC) PF 100 MG/5ML IV SOSY
PREFILLED_SYRINGE | INTRAVENOUS | Status: DC | PRN
  Administered 2022-04-18: 50 mg via INTRAVENOUS

## 2022-04-18 NOTE — Interval H&P Note (Signed)
History and Physical Interval Note:  04/18/2022 9:01 AM  Stephanie Acosta  has presented today for surgery, with the diagnosis of IDA.  The various methods of treatment have been discussed with the patient and family. After consideration of risks, benefits and other options for treatment, the patient has consented to  Procedure(s) with comments: COLONOSCOPY WITH PROPOFOL (N/A) - 10:30 AM, ASA 2 as a surgical intervention.  The patient's history has been reviewed, patient examined, no change in status, stable for surgery.  I have reviewed the patient's chart and labs.  Questions were answered to the patient's satisfaction.     [name]

## 2022-04-18 NOTE — Transfer of Care (Signed)
Immediate Anesthesia Transfer of Care Note  Patient: Stephanie Acosta  Procedure(s) Performed: COLONOSCOPY WITH PROPOFOL  Patient Location: Endoscopy Unit  Anesthesia Type:General  Level of Consciousness: awake, alert , and oriented  Airway & Oxygen Therapy: Patient Spontanous Breathing  Post-op Assessment: Report given to RN and Post -op Vital signs reviewed and stable  Post vital signs: Reviewed and stable  Last Vitals:  Vitals Value Taken Time  BP 96/49 04/18/22 1000  Temp 36.7 C 04/18/22 1000  Pulse 90 04/18/22 1000  Resp 12 04/18/22 1000  SpO2 99 % 04/18/22 1000    Last Pain:  Vitals:   04/18/22 1000  TempSrc: Axillary  PainSc: 0-No pain      Patients Stated Pain Goal: 4 ([id number])  Complications: No notable events documented.

## 2022-04-18 NOTE — Op Note (Signed)
[facility] Patient Name: Stephanie Acosta Procedure Date: 04/18/2022 9:37 AM MRN: [id number] Date of Birth: 1990/12/31 Attending MD: Elon Alas. Edgar Frisk, [id number] CSN: [id number] Age: 32 Admit Type: Outpatient Procedure:                Colonoscopy Indications:              Iron deficiency anemia Providers:                Elon Alas. Abbey Chatters, DO, Crystal Page, Rosina Lowenstein,                            RN Referring MD:              Medicines:                See the Anesthesia note for documentation of the                            administered medications Complications:            No immediate complications. Estimated Blood Loss:     Estimated blood loss: none. Procedure:                Pre-Anesthesia Assessment:                           - The anesthesia plan was to use monitored                            anesthesia care (MAC).                           After obtaining informed consent, the colonoscope                            was passed under direct vision. Throughout the                            procedure, the patient's blood pressure, pulse, and                            oxygen saturations were monitored continuously. The                            [id number]) scope was introduced through                            the anus and advanced to the the terminal ileum,                            with identification of the appendiceal orifice and                            IC valve. The colonoscopy was performed without                            difficulty. The patient tolerated the procedure  well. The quality of the bowel preparation was                            evaluated using the BBPS [facility] Bowel Preparation                            Scale) with scores of: Right Colon = 3, Transverse                            Colon = 3 and Left Colon = 3 (entire mucosa seen                            well with no residual staining, small  fragments of                            stool or opaque liquid). The total BBPS score                            equals 9. Scope In: [id number] AM Scope Out: 9:57:39 AM Scope Withdrawal Time: 0 hours 6 minutes 15 seconds  Total Procedure Duration: 0 hours 11 minutes 30 seconds  Findings:      The perianal and digital rectal examinations were normal.      The colon (entire examined portion) appeared normal.      The terminal ileum appeared normal. Impression:               - The entire examined colon is normal.                           - The examined portion of the ileum was normal.                           - No specimens collected. Moderate Sedation:      Per Anesthesia Care Recommendation:           - Patient has a contact number available for                            emergencies. The signs and symptoms of potential                            delayed complications were discussed with the                            patient. Return to normal activities tomorrow.                            Written discharge instructions were provided to the                            patient.                           - Resume previous diet.                           -  Continue present medications.                           - Repeat colonoscopy at age 68 or sooner if higher                            risk for screening purposes.                           - Return to GI clinic in 3 months. Procedure Code(s):        --- Professional ---                           [phone], Colonoscopy, flexible; diagnostic, including                            collection of specimen(s) by brushing or washing,                            when performed (separate procedure) Diagnosis Code(s):        --- Professional ---                           D50.9, Iron deficiency anemia, unspecified CPT copyright 2022 American Medical Association. All rights reserved. The codes documented in this report are preliminary and upon coder  review may  be revised to meet current compliance requirements. Elon Alas. Abbey Chatters, DO [place] Abbey Chatters, DO 04/18/2022 10:00:51 AM This report has been signed electronically. Number of Addenda: 0

## 2022-04-18 NOTE — Discharge Instructions (Signed)
  Colonoscopy Discharge Instructions  Read the instructions outlined below and refer to this sheet in the next few weeks. These discharge instructions provide you with general information on caring for yourself after you leave the hospital. Your doctor may also give you specific instructions. While your treatment has been planned according to the most current medical practices available, unavoidable complications occasionally occur.   ACTIVITY You may resume your regular activity, but move at a slower pace for the next 24 hours.  Take frequent rest periods for the next 24 hours.  Walking will help get rid of the air and reduce the bloated feeling in your belly (abdomen).  No driving for 24 hours (because of the medicine (anesthesia) used during the test).   Do not sign any important legal documents or operate any machinery for 24 hours (because of the anesthesia used during the test).  NUTRITION Drink plenty of fluids.  You may resume your normal diet as instructed by your doctor.  Begin with a light meal and progress to your normal diet. Heavy or fried foods are harder to digest and may make you feel sick to your stomach (nauseated).  Avoid alcoholic beverages for 24 hours or as instructed.  MEDICATIONS You may resume your normal medications unless your doctor tells you otherwise.  WHAT YOU CAN EXPECT TODAY Some feelings of bloating in the abdomen.  Passage of more gas than usual.  Spotting of blood in your stool or on the toilet paper.  IF YOU HAD POLYPS REMOVED DURING THE COLONOSCOPY: No aspirin products for 7 days or as instructed.  No alcohol for 7 days or as instructed.  Eat a soft diet for the next 24 hours.  FINDING OUT THE RESULTS OF YOUR TEST Not all test results are available during your visit. If your test results are not back during the visit, make an appointment with your caregiver to find out the results. Do not assume everything is normal if you have not heard from your  caregiver or the medical facility. It is important for you to follow up on all of your test results.  SEEK IMMEDIATE MEDICAL ATTENTION IF: You have more than a spotting of blood in your stool.  Your belly is swollen (abdominal distention).  You are nauseated or vomiting.  You have a temperature over 101.  You have abdominal pain or discomfort that is severe or gets worse throughout the day.   Overall, your colon very healthy today.  I did not find any polyps or evidence of colon cancer.  No active inflammation indicative of underlying inflammatory bowel disease such as Crohn's disease or ulcerative colitis.  Recommend repeat colonoscopy at age 32 for colon cancer screening purposes.  Follow-up with GI in 2 to 3 months.  I hope you have a great rest of your week!  Elon Alas. Abbey Chatters, D.O. Gastroenterology and Hepatology [facility]

## 2022-04-18 NOTE — Anesthesia Preprocedure Evaluation (Signed)
Anesthesia Evaluation  Patient identified by MRN, date of birth, ID band Patient awake    Reviewed: Allergy & Precautions, H&P , NPO status , Patient's Chart, lab work & pertinent test results, reviewed documented beta blocker date and time   Airway Mallampati: II  TM Distance: >3 FB Neck ROM: full    Dental no notable dental hx.    Pulmonary neg pulmonary ROS   Pulmonary exam normal breath sounds clear to auscultation       Cardiovascular Exercise Tolerance: Good negative cardio ROS  Rhythm:regular Rate:Normal     Neuro/Psych  PSYCHIATRIC DISORDERS Anxiety Depression    negative neurological ROS  negative psych ROS   GI/Hepatic negative GI ROS, Neg liver ROS,GERD  ,,  Endo/Other  negative endocrine ROS    Renal/GU negative Renal ROS  negative genitourinary   Musculoskeletal   Abdominal   Peds  Hematology negative hematology ROS (+) Blood dyscrasia, anemia   Anesthesia Other Findings   Reproductive/Obstetrics negative OB ROS                             Anesthesia Physical Anesthesia Plan  ASA: 2  Anesthesia Plan: General   Post-op Pain Management:    Induction:   PONV Risk Score and Plan: Propofol infusion  Airway Management Planned:   Additional Equipment:   Intra-op Plan:   Post-operative Plan:   Informed Consent: I have reviewed the patients History and Physical, chart, labs and discussed the procedure including the risks, benefits and alternatives for the proposed anesthesia with the patient or authorized representative who has indicated his/her understanding and acceptance.     Dental Advisory Given  Plan Discussed with: CRNA  Anesthesia Plan Comments:        Anesthesia Quick Evaluation

## 2022-04-19 ENCOUNTER — Encounter: Payer: Self-pay | Admitting: Hematology

## 2022-04-19 ENCOUNTER — Emergency Department (HOSPITAL_COMMUNITY)
Admission: EM | Admit: 2022-04-19 | Discharge: 2022-04-19 | Disposition: A | Discharge status: Home / Self Care | Payer: Federal, State, Local not specified - PPO | Attending: Emergency Medicine | Admitting: Emergency Medicine

## 2022-04-19 ENCOUNTER — Encounter (HOSPITAL_COMMUNITY): Payer: Self-pay | Admitting: Emergency Medicine

## 2022-04-19 ENCOUNTER — Other Ambulatory Visit: Payer: Self-pay

## 2022-04-19 DIAGNOSIS — E876 Hypokalemia: Secondary | ICD-10-CM | POA: Diagnosis not present

## 2022-04-19 DIAGNOSIS — E860 Dehydration: Secondary | ICD-10-CM | POA: Diagnosis not present

## 2022-04-19 DIAGNOSIS — R739 Hyperglycemia, unspecified: Secondary | ICD-10-CM

## 2022-04-19 DIAGNOSIS — E875 Hyperkalemia: Secondary | ICD-10-CM | POA: Insufficient documentation

## 2022-04-19 DIAGNOSIS — M62838 Other muscle spasm: Secondary | ICD-10-CM | POA: Diagnosis present

## 2022-04-19 LAB — CBC
HCT: 33.6 % — ABNORMAL LOW (ref 36.0–46.0)
Hemoglobin: 10.3 g/dL — ABNORMAL LOW (ref 12.0–15.0)
MCH: 25.5 pg — ABNORMAL LOW (ref 26.0–34.0)
MCHC: 30.7 g/dL (ref 30.0–36.0)
MCV: 83.2 fL (ref 80.0–100.0)
Platelets: 210 10*3/uL (ref 150–400)
RBC: 4.04 MIL/uL (ref 3.87–5.11)
RDW: 19.8 % — ABNORMAL HIGH (ref 11.5–15.5)
WBC: 8.2 10*3/uL (ref 4.0–10.5)
nRBC: 0 % (ref 0.0–0.2)

## 2022-04-19 LAB — COMPREHENSIVE METABOLIC PANEL
ALT: 46 U/L — ABNORMAL HIGH (ref 0–44)
AST: 54 U/L — ABNORMAL HIGH (ref 15–41)
Albumin: 3.6 g/dL (ref 3.5–5.0)
Alkaline Phosphatase: 70 U/L (ref 38–126)
Anion gap: 10 (ref 5–15)
BUN: 13 mg/dL (ref 6–20)
CO2: 17 mmol/L — ABNORMAL LOW (ref 22–32)
Calcium: 8.2 mg/dL — ABNORMAL LOW (ref 8.9–10.3)
Chloride: 105 mmol/L (ref 98–111)
Creatinine, Ser: 0.94 mg/dL (ref 0.44–1.00)
GFR, Estimated: >60 mL/min (ref >60)
Glucose, Bld: 246 mg/dL — ABNORMAL HIGH (ref 70–99)
Potassium: 3 mmol/L — ABNORMAL LOW (ref 3.5–5.1)
Sodium: 132 mmol/L — ABNORMAL LOW (ref 135–145)
Total Bilirubin: 0.4 mg/dL (ref 0.3–1.2)
Total Protein: 6.7 g/dL (ref 6.5–8.1)

## 2022-04-19 LAB — CBG MONITORING, ED: Glucose-Capillary: 174 mg/dL — ABNORMAL HIGH (ref 70–99)

## 2022-04-19 LAB — MAGNESIUM: Magnesium: 1.9 mg/dL (ref 1.7–2.4)

## 2022-04-19 MED ORDER — KETOROLAC TROMETHAMINE 30 MG/ML IJ SOLN: 30.0000 mg | Freq: Once | INTRAMUSCULAR | Status: DC

## 2022-04-19 MED ORDER — KETOROLAC TROMETHAMINE 60 MG/2ML IM SOLN: 60.0000 mg | Freq: Once | INTRAMUSCULAR | Status: DC

## 2022-04-19 MED ORDER — LACTATED RINGERS IV BOLUS
1000.0000 mL | Freq: Once | INTRAVENOUS | Status: AC
  Administered 2022-04-19: 1000 mL via INTRAVENOUS

## 2022-04-19 MED ORDER — METFORMIN HCL 500 MG PO TABS: 500.0000 mg | ORAL_TABLET | Freq: Two times a day (BID) | ORAL | 0 refills | Status: DC

## 2022-04-19 MED ORDER — POTASSIUM CHLORIDE CRYS ER 20 MEQ PO TBCR: 20.0000 meq | EXTENDED_RELEASE_TABLET | Freq: Every day | ORAL | 0 refills | Status: DC

## 2022-04-19 MED ORDER — KETOROLAC TROMETHAMINE 30 MG/ML IJ SOLN
15.0000 mg | Freq: Once | INTRAMUSCULAR | Status: AC
  Administered 2022-04-19: 15 mg via INTRAVENOUS
  Filled 2022-04-19: qty 1

## 2022-04-19 MED ORDER — POTASSIUM CHLORIDE CRYS ER 20 MEQ PO TBCR
40.0000 meq | EXTENDED_RELEASE_TABLET | Freq: Once | ORAL | Status: AC
  Administered 2022-04-19: 40 meq via ORAL
  Filled 2022-04-19: qty 2

## 2022-04-19 NOTE — ED Notes (Signed)
ED Provider at bedside. 

## 2022-04-19 NOTE — ED Notes (Signed)
EDP made aware of pt's BP. See Mar

## 2022-04-19 NOTE — ED Triage Notes (Signed)
Pt c/o generalized muscle spasms that started while she was sleeping. Pt took muscle relaxer after they started with no improvement.

## 2022-04-19 NOTE — Anesthesia Postprocedure Evaluation (Signed)
Anesthesia Post Note  Patient: Stephanie Acosta  Procedure(s) Performed: COLONOSCOPY WITH PROPOFOL  Patient location during evaluation: Phase II Anesthesia Type: General Level of consciousness: awake Pain management: pain level controlled Vital Signs Assessment: post-procedure vital signs reviewed and stable Respiratory status: spontaneous breathing and respiratory function stable Cardiovascular status: blood pressure returned to baseline and stable Postop Assessment: no headache and no apparent nausea or vomiting Anesthetic complications: no Comments: Late entry   No notable events documented.   Last Vitals:  Vitals:   04/18/22 0928 04/18/22 1000  BP: 120/69 (!) 96/49  Pulse: 76 90  Resp: 18 12  Temp: 37 C 36.7 C  SpO2: 99% 99%    Last Pain:  Vitals:   04/18/22 1000  TempSrc: Axillary  PainSc: 0-No pain                 [name]

## 2022-04-19 NOTE — ED Provider Notes (Signed)
[facility] Provider Note   CSN: [id number] Arrival date & time: 04/19/22  [id number]     History  Chief Complaint  Patient presents with   Spasms    Stephanie Acosta is a 32 y.o. female.  Patient presents to the emergency department for evaluation of diffuse muscle spasms.  Patient reports that symptoms hit overnight.  She was having spasms in her arms, across her chest and down her legs.  Patient reports that she had a colonoscopy this morning and was concerned that she might of dehydration and electrolyte abnormality secondary to the bowel prep.  She has drank water and Gatorade, took a muscle relaxer.  Spasms are improved but have not completely resolved.       Home Medications Prior to Admission medications   Medication Sig Start Date End Date Taking? Authorizing Provider  metFORMIN (GLUCOPHAGE) 500 MG tablet Take 1 tablet (500 mg total) by mouth 2 (two) times daily with a meal. 04/19/22  Yes Falynn Ailey, Gwenyth Allegra, MD  potassium chloride SA (KLOR-CON M) 20 MEQ tablet Take 1 tablet (20 mEq total) by mouth daily. 04/19/22  Yes Christophe Rising, Gwenyth Allegra, MD  buPROPion (WELLBUTRIN XL) 300 MG 24 hr tablet Take 300 mg by mouth daily.    [provider]  esomeprazole (NEXIUM) 40 MG capsule Take 1 capsule (40 mg total) by mouth daily before breakfast. 03/27/22   Annitta Needs, NP  etonogestrel (NEXPLANON) 68 MG IMPL implant 68 mg by Subdermal route once. 04/21/18   [provider]  FLUoxetine (PROZAC) 20 MG tablet Take 20 mg by mouth daily.    [provider]  hydrOXYzine (ATARAX) 25 MG tablet Take 25 mg by mouth 3 (three) times daily as needed for anxiety. 08/29/20   [provider]  loperamide (IMODIUM) 2 MG capsule     [provider]  ondansetron (ZOFRAN-ODT) 4 MG disintegrating tablet     [provider]  promethazine (PHENERGAN) 12.5 MG tablet Take 1 tablet (12.5 mg total) by mouth every 8  (eight) hours as needed for nausea or vomiting. May take 2 tablets if needed every 8 hours. 11/20/21   Annitta Needs, NP  propranolol (INDERAL) 20 MG tablet Take 20 mg by mouth 2 (two) times daily.    [provider]  Rimegepant Sulfate (NURTEC) 75 MG TBDP Take 75 mg by mouth daily as needed (migraines).    [provider]  Semaglutide,0.25 or 0.5MG /DOS, (OZEMPIC, 0.25 OR 0.5 MG/DOSE,) 2 MG/1.5ML SOPN 2 mg every week by sub-q route.    [provider]  tizanidine (ZANAFLEX) 2 MG capsule Take 2 mg by mouth 3 (three) times daily as needed for muscle spasms.    [provider]  topiramate (TOPAMAX) 25 MG tablet Take 50 mg by mouth 2 (two) times daily. 08/31/21   [provider]  venlafaxine XR (EFFEXOR-XR) 150 MG 24 hr capsule Take 75 mg by mouth daily with breakfast.    [provider]      Allergies    Tegaderm ag mesh [silver], Tuberculin ppd, Wound dressing adhesive, and Sulfa antibiotics    Review of Systems   Review of Systems  Physical Exam Updated Vital Signs BP 97/65   Pulse 94   Temp 98.7 F (37.1 C) (Oral)   Resp 16   Ht 5' 4.5" (1.638 m)   Wt 93 kg   SpO2 97%   BMI 34.64 kg/m  Physical Exam Vitals and nursing  note reviewed.  Constitutional:      General: She is not in acute distress.    Appearance: She is well-developed.  HENT:     Head: Normocephalic and atraumatic.     Mouth/Throat:     Mouth: Mucous membranes are moist.  Eyes:     General: Vision grossly intact. Gaze aligned appropriately.     Extraocular Movements: Extraocular movements intact.     Conjunctiva/sclera: Conjunctivae normal.  Cardiovascular:     Rate and Rhythm: Normal rate and regular rhythm.     Pulses: Normal pulses.     Heart sounds: Normal heart sounds, S1 normal and S2 normal. No murmur heard.    No friction rub. No gallop.  Pulmonary:     Effort: Pulmonary effort is normal. No respiratory distress.     Breath sounds: Normal breath  sounds.  Abdominal:     General: Bowel sounds are normal.     Palpations: Abdomen is soft.     Tenderness: There is no abdominal tenderness. There is no guarding or rebound.     Hernia: No hernia is present.  Musculoskeletal:        General: No swelling.     Cervical back: Full passive range of motion without pain, normal range of motion and neck supple. No spinous process tenderness or muscular tenderness. Normal range of motion.     Right lower leg: No edema.     Left lower leg: No edema.  Skin:    General: Skin is warm and dry.     Capillary Refill: Capillary refill takes less than 2 seconds.     Findings: No ecchymosis, erythema, rash or wound.  Neurological:     General: No focal deficit present.     Mental Status: She is alert and oriented to person, place, and time.     GCS: GCS eye subscore is 4. GCS verbal subscore is 5. GCS motor subscore is 6.     Cranial Nerves: Cranial nerves 2-12 are intact.     Sensory: Sensation is intact.     Motor: Motor function is intact.     Coordination: Coordination is intact.  Psychiatric:        Attention and Perception: Attention normal.        Mood and Affect: Mood normal.        Speech: Speech normal.        Behavior: Behavior normal.     ED Results / Procedures / Treatments   Labs (all labs ordered are listed, but only abnormal results are displayed) Labs Reviewed  CBC - Abnormal; Notable for the following components:      Result Value   Hemoglobin 10.3 (*)    HCT 33.6 (*)    MCH 25.5 (*)    RDW 19.8 (*)    All other components within normal limits  COMPREHENSIVE METABOLIC PANEL - Abnormal; Notable for the following components:   Sodium 132 (*)    Potassium 3.0 (*)    CO2 17 (*)    Glucose, Bld 246 (*)    Calcium 8.2 (*)    AST 54 (*)    ALT 46 (*)    All other components within normal limits  CBG MONITORING, ED - Abnormal; Notable for the following components:   Glucose-Capillary 174 (*)    All other components within  normal limits  MAGNESIUM    EKG None  Radiology No results found.  Procedures Procedures    Medications Ordered in ED Medications  potassium chloride SA (KLOR-CON M) CR tablet 40 mEq (has no administration in time range)  lactated ringers bolus 1,000 mL (0 mLs Intravenous Stopped 04/19/22 0530)  ketorolac (TORADOL) 30 MG/ML injection 15 mg (15 mg Intravenous Given 04/19/22 0432)  lactated ringers bolus 1,000 mL (1,000 mLs Intravenous New Bag/Given 04/19/22 0545)    ED Course/ Medical Decision Making/ A&P                             Medical Decision Making Amount and/or Complexity of Data Reviewed Labs: ordered.  Risk Prescription drug management.   Patient presents to the emergency department for evaluation of diffuse muscle cramps.  Patient just finished a bowel prep for colonoscopy that occurred this morning.  Dehydration as well as electrolyte abnormality considered in the differential diagnosis.  She was tachycardic at arrival.  The fluid bolus was initiated.  Electrolytes were checked.  She does have hypokalemia, will be given oral replacement.  This is likely due to her bowel prep.  Additionally, she is found to have an elevated blood sugar.  Patient reports that she does have a history of borderline blood sugars/diabetes and is currently working through insurance to get started on Ozempic.  Sugar was fairly high today, will temporarily place her on Glucophage until she can follow-up with her primary doctor and get the medications worked out.        Final Clinical Impression(s) / ED Diagnoses Final diagnoses:  Hypokalemia  Dehydration  Hyperkalemia    Rx / DC Orders ED Discharge Orders          Ordered    potassium chloride SA (KLOR-CON M) 20 MEQ tablet  Daily        04/19/22 0700    metFORMIN (GLUCOPHAGE) 500 MG tablet  2 times daily with meals        04/19/22 0700              Orpah Greek, MD 04/19/22 0700

## 2022-04-29 ENCOUNTER — Encounter (HOSPITAL_COMMUNITY): Payer: Self-pay | Admitting: Internal Medicine

## 2022-05-17 ENCOUNTER — Inpatient Hospital Stay: Payer: Federal, State, Local not specified - PPO | Attending: Hematology

## 2022-05-17 DIAGNOSIS — D509 Iron deficiency anemia, unspecified: Secondary | ICD-10-CM | POA: Insufficient documentation

## 2022-05-17 DIAGNOSIS — D649 Anemia, unspecified: Secondary | ICD-10-CM

## 2022-05-17 LAB — CBC WITH DIFFERENTIAL/PLATELET
Abs Immature Granulocytes: 0.02 K/uL (ref 0.00–0.07)
Basophils Absolute: 0.1 K/uL (ref 0.0–0.1)
Basophils Relative: 1 %
Eosinophils Absolute: 0.1 K/uL (ref 0.0–0.5)
Eosinophils Relative: 1 %
HCT: 40.3 % (ref 36.0–46.0)
Hemoglobin: 12.6 g/dL (ref 12.0–15.0)
Immature Granulocytes: 0 %
Lymphocytes Relative: 38 %
Lymphs Abs: 2.9 K/uL (ref 0.7–4.0)
MCH: 27.5 pg (ref 26.0–34.0)
MCHC: 31.3 g/dL (ref 30.0–36.0)
MCV: 88 fL (ref 80.0–100.0)
Monocytes Absolute: 0.4 K/uL (ref 0.1–1.0)
Monocytes Relative: 6 %
Neutro Abs: 4.1 K/uL (ref 1.7–7.7)
Neutrophils Relative %: 54 %
Platelets: 310 K/uL (ref 150–400)
RBC: 4.58 MIL/uL (ref 3.87–5.11)
RDW: 19.2 % — ABNORMAL HIGH (ref 11.5–15.5)
WBC: 7.6 K/uL (ref 4.0–10.5)
nRBC: 0 % (ref 0.0–0.2)

## 2022-05-17 LAB — IRON AND TIBC
Iron: 69 ug/dL (ref 28–170)
Saturation Ratios: 18 % (ref 10.4–31.8)
TIBC: 388 ug/dL (ref 250–450)
UIBC: 319 ug/dL

## 2022-05-17 LAB — FERRITIN: Ferritin: 126 ng/mL (ref 11–307)

## 2022-05-17 LAB — RETICULOCYTES
Immature Retic Fract: 10.8 % (ref 2.3–15.9)
RBC.: 4.69 MIL/uL (ref 3.87–5.11)
Retic Count, Absolute: 78.8 10*3/uL (ref 19.0–186.0)
Retic Ct Pct: 1.7 % (ref 0.4–3.1)

## 2022-05-21 LAB — COPPER, SERUM: Copper: 108 ug/dL (ref 80–158)

## 2022-05-27 ENCOUNTER — Ambulatory Visit: Payer: Federal, State, Local not specified - PPO | Admitting: Hematology

## 2022-05-27 LAB — METHYLMALONIC ACID, SERUM: Methylmalonic Acid, Quantitative: 142 nmol/L (ref 0–378)

## 2022-05-30 ENCOUNTER — Inpatient Hospital Stay (HOSPITAL_BASED_OUTPATIENT_CLINIC_OR_DEPARTMENT_OTHER): Payer: Federal, State, Local not specified - PPO | Admitting: Physician Assistant

## 2022-05-30 ENCOUNTER — Encounter: Payer: Self-pay | Admitting: Physician Assistant

## 2022-05-30 VITALS — BP 114/78 | HR 97 | Temp 97.8°F | Resp 18 | Wt 212.9 lb

## 2022-05-30 DIAGNOSIS — D509 Iron deficiency anemia, unspecified: Secondary | ICD-10-CM | POA: Diagnosis not present

## 2022-05-30 DIAGNOSIS — R040 Epistaxis: Secondary | ICD-10-CM | POA: Diagnosis not present

## 2022-05-30 NOTE — Progress Notes (Signed)
[facility] [address], [place] [address]   Clinic Day:  05/30/2022  Referring physician: Roger Kill, *  Patient Care Team: Bernita Buffy as PCP - General (Physician Assistant) Doreatha Massed, MD as Medical Oncologist (Hematology)   CHIEF COMPLAINT/PURPOSE OF CONSULT:   Diagnosis: Iron deficiency anemia  Current Therapy: Feraheme 510 mg x 2 doses  HISTORY OF PRESENT ILLNESS:   Stephanie Acosta is a 32 y.o. female presenting to clinic today for evaluation of iron deficiency anaemia. She was last seen on 04/01/2022. In the interim, she received IV feraheme 510 mg x 2 doses from 04/03/2022 and 04/10/2022. She is unaccompanied for this visit.   Today, Stephanie Acosta reports she continues to have extreme fatigue that does not improve with rest. She denies any improvement with her energy levels after receiving IV iron infusion.She is able to complete her ADLs but needs to rest frequently. She reports occasional nausea that improves with phenergan and zofran as needed. She denies any vomiting episodes. She denies any abdominal pain and bowel habits are unchanged. She reports weekly nosebleeds that has been chronic. She has no other signs of bleeding including menstrual bleeding. She denies fevers, chills, sweats, shortness of breath or chest pain. She has no other complaints.   PAST MEDICAL HISTORY:   Past Medical History: Past Medical History:  Diagnosis Date   Anxiety    Celiac disease sept 2015   Depression    Endometriosis    GERD (gastroesophageal reflux disease)    IBS (irritable bowel syndrome)    PCOS (polycystic ovarian syndrome) 2013   Pelvic pain in female    Wears contact lenses     Surgical History: Past Surgical History:  Procedure Laterality Date   BIOPSY  11/21/2021   Procedure: BIOPSY;  Surgeon: Lanelle Bal, DO;  Location: [facility];  Service: Endoscopy;;   COLONOSCOPY WITH PROPOFOL N/A 04/18/2022   Procedure:  COLONOSCOPY WITH PROPOFOL;  Surgeon: Lanelle Bal, DO;  Location: [facility];  Service: Endoscopy;  Laterality: N/A;  10:30 AM, ASA 2   DILATION AND EVACUATION N/A 07/09/2012   Procedure: DILATATION AND EVACUATION;  Surgeon: Meriel Pica, MD;  Location: [facility];  Service: Gynecology;  Laterality: N/A;   ESOPHAGOGASTRODUODENOSCOPY (EGD) WITH PROPOFOL N/A 11/21/2021   Procedure: ESOPHAGOGASTRODUODENOSCOPY (EGD) WITH PROPOFOL;  Surgeon: Lanelle Bal, DO;  Location: [facility];  Service: Endoscopy;  Laterality: N/A;  12:45pm, asa 2   LAPAROSCOPY N/A 09/07/2014   Procedure: LAPAROSCOPY DIAGNOSTIC;  Surgeon: Richarda Overlie, MD;  Location: [facility] [place];  Service: Gynecology;  Laterality: N/A;   WISDOM TOOTH EXTRACTION      Social History: Social History   Socioeconomic History   Marital status: Married    Spouse name: Not on file   Number of children: Not on file   Years of education: Not on file   Highest education level: Not on file  Occupational History   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 [place]  Tobacco Use   Smoking status: Never   Smokeless tobacco: Never  Vaping Use   Vaping Use: Never used  Substance and Sexual Activity   Alcohol use: Not Currently    Comment: none since 2022   Drug use: No   Sexual activity: Yes    Birth control/protection: Implant  Other Topics Concern   Not on file  Social History Narrative   Oct 2023: 2 kids, ages 31 year old girl and 20-year-old boy  Social Determinants of Health   Financial Resource Strain: Not on file  Food Insecurity: No Food Insecurity (04/01/2022)   Hunger Vital Sign    Worried About Running Out of Food in the Last Year: Never true    Ran Out of Food in the Last Year: Never true  Transportation Needs: No Transportation Needs (04/01/2022)   PRAPARE - Administrator, Civil Service (Medical): No    Lack of Transportation (Non-Medical): No  Physical Activity: Not on file  Stress: Not  on file  Social Connections: Not on file  Intimate Partner Violence: Not At Risk (04/01/2022)   Humiliation, Afraid, Rape, and Kick questionnaire    Fear of Current or Ex-Partner: No    Emotionally Abused: No    Physically Abused: No    Sexually Abused: No    Family History: Family History  Problem Relation Age of Onset   Depression Mother    Alcohol abuse Mother    Crohn's disease Mother    Colon polyps Father    Diabetes Maternal Grandfather    Diabetes Paternal Grandmother    Bipolar disorder Maternal Aunt    Alcohol abuse Maternal Uncle    Alcohol abuse Maternal Uncle    Alcohol abuse Maternal Uncle    Alcohol abuse Maternal Uncle    Colon cancer Neg Hx        great grandfather had colon cancer but no first degree relatives    Current Medications:  Current Outpatient Medications:    buPROPion (WELLBUTRIN XL) 300 MG 24 hr tablet, Take 300 mg by mouth daily., Disp: , Rfl:    esomeprazole (NEXIUM) 40 MG capsule, Take 1 capsule (40 mg total) by mouth daily before breakfast., Disp: 90 capsule, Rfl: 3   etonogestrel (NEXPLANON) 68 MG IMPL implant, 68 mg by Subdermal route once., Disp: , Rfl:    FLUoxetine (PROZAC) 20 MG tablet, Take 20 mg by mouth daily., Disp: , Rfl:    hydrOXYzine (ATARAX) 25 MG tablet, Take 25 mg by mouth 3 (three) times daily as needed for anxiety., Disp: , Rfl:    loperamide (IMODIUM) 2 MG capsule, , Disp: , Rfl:    ondansetron (ZOFRAN-ODT) 4 MG disintegrating tablet, , Disp: , Rfl:    promethazine (PHENERGAN) 12.5 MG tablet, Take 1 tablet (12.5 mg total) by mouth every 8 (eight) hours as needed for nausea or vomiting. May take 2 tablets if needed every 8 hours., Disp: 30 tablet, Rfl: 0   propranolol (INDERAL) 20 MG tablet, Take 20 mg by mouth 2 (two) times daily., Disp: , Rfl:    Rimegepant Sulfate (NURTEC) 75 MG TBDP, Take 75 mg by mouth daily as needed (migraines)., Disp: , Rfl:    Semaglutide,0.25 or 0.5MG /DOS, (OZEMPIC, 0.25 OR 0.5 MG/DOSE,) 2 MG/1.5ML  SOPN, 2 mg every week by sub-q route., Disp: , Rfl:    tizanidine (ZANAFLEX) 2 MG capsule, Take 2 mg by mouth 3 (three) times daily as needed for muscle spasms., Disp: , Rfl:    topiramate (TOPAMAX) 25 MG tablet, Take 50 mg by mouth 2 (two) times daily., Disp: , Rfl:    Allergies: Allergies  Allergen Reactions   Tegaderm Ag Mesh [Silver] Other (See Comments)    Skin irritation and itching   Tuberculin Ppd Hives and Swelling    Skin Test    Wound Dressing Adhesive     Skin irritation and itching   Sulfa Antibiotics Rash    Childhood reaction    REVIEW OF SYSTEMS:  Review of Systems  Constitutional:  Positive for fatigue. Negative for chills and fever.  HENT:   Negative for lump/mass, mouth sores, nosebleeds, sore throat and trouble swallowing.   Eyes:  Negative for eye problems.  Respiratory:  Positive for cough (occasional dry cough). Negative for shortness of breath.   Cardiovascular:  Negative for chest pain, leg swelling and palpitations.  Gastrointestinal:  Positive for constipation, diarrhea and nausea. Negative for abdominal pain and vomiting.  Genitourinary:  Negative for bladder incontinence, difficulty urinating, dysuria, frequency, hematuria and nocturia.   Musculoskeletal:  Negative for arthralgias, back pain, flank pain, myalgias and neck pain.  Skin:  Negative for itching and rash.  Neurological:  Positive for dizziness and numbness. Seizures: Tingles Hands & Feet. Hematological:  Does not bruise/bleed easily.  Psychiatric/Behavioral:  Negative for suicidal ideas.   All other systems reviewed and are negative.    VITALS:   Blood pressure 114/78, pulse 97, temperature 97.8 F (36.6 C), temperature source Oral, resp. rate 18, weight 212 lb 14.4 oz (96.6 kg), SpO2 100 %.  Wt Readings from Last 3 Encounters:  05/30/22 212 lb 14.4 oz (96.6 kg)  04/19/22 205 lb (93 kg)  04/18/22 205 lb (93 kg)    Body mass index is 35.98 kg/m.   PHYSICAL EXAM:   Physical  Exam Vitals reviewed.  Constitutional:      Appearance: Normal appearance.  Cardiovascular:     Rate and Rhythm: Normal rate and regular rhythm.     Heart sounds: Normal heart sounds.  Pulmonary:     Effort: Pulmonary effort is normal.     Breath sounds: Normal breath sounds.  Abdominal:     Palpations: There is no hepatomegaly or splenomegaly.  Musculoskeletal:     Right lower leg: No edema.     Left lower leg: No edema.  Lymphadenopathy:     Cervical: Cervical adenopathy present.     Upper Body:     Right upper body: No supraclavicular adenopathy.     Left upper body: No supraclavicular adenopathy.  Neurological:     General: No focal deficit present.     Mental Status: She is alert and oriented to person, place, and time.  Psychiatric:        Mood and Affect: Mood normal.        Behavior: Behavior normal.     LABS:      Latest Ref Rng & Units 05/17/2022    2:13 PM 04/19/2022    4:06 AM 11/18/2021    6:32 PM  CBC  WBC 4.0 - 10.5 K/uL 7.6  8.2  7.1   Hemoglobin 12.0 - 15.0 g/dL 16.1  09.6  04.5   Hematocrit 36.0 - 46.0 % 40.3  33.6  34.7   Platelets 150 - 400 K/uL 310  210  309       Latest Ref Rng & Units 04/19/2022    4:06 AM 11/18/2021    6:32 PM  CMP  Glucose 70 - 99 mg/dL 409  811   BUN 6 - 20 mg/dL 13  10   Creatinine 9.14 - 1.00 mg/dL 7.82  9.56   Sodium 213 - 145 mmol/L 132  135   Potassium 3.5 - 5.1 mmol/L 3.0  3.5   Chloride 98 - 111 mmol/L 105  105   CO2 22 - 32 mmol/L 17  23   Calcium 8.9 - 10.3 mg/dL 8.2  9.2   Total Protein 6.5 - 8.1 g/dL 6.7  7.9  Total Bilirubin 0.3 - 1.2 mg/dL 0.4  0.6   Alkaline Phos 38 - 126 U/L 70  57   AST 15 - 41 U/L 54  31   ALT 0 - 44 U/L 46  31      No results found for: "CEA1", "CEA" / No results found for: "CEA1", "CEA" No results found for: "PSA1" No results found for: "RUE454" No results found for: "CAN125"  No results found for: "TOTALPROTELP", "ALBUMINELP", "A1GS", "A2GS", "BETS", "BETA2SER", "GAMS",  "MSPIKE", "SPEI" Lab Results  Component Value Date   TIBC 388 05/17/2022   TIBC 527 (H) 11/20/2021   FERRITIN 126 05/17/2022   FERRITIN 5 (L) 11/20/2021   IRONPCTSAT 18 05/17/2022   IRONPCTSAT 7 (L) 11/20/2021   No results found for: "LDH"   STUDIES:   No results found.    ASSESSMENT & PLAN:   Assessment:  1.  Iron deficiency anemia: - History of celiac disease diagnosed by [facility] over 5 years ago, recent EGD in October 2023 with benign duodenal biopsy and serologies negative - On iron tablet daily for the last 15-16 years.  Gets constipated and has abdominal pain and nausea from its so discontinued PO iron.   2.  Social/family history: -Lives at home with her husband.  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 - Non-smoker. - No family history of severe anemia.  Paternal great grandfather had colon cancer.  Paternal cousins had bladder cancer.  Her mother's twin sister had breast cancer.  Maternal cousin has leukemia.  Maternal great uncle also had leukemia.  Plan: Iron deficiency anemia: - Received IV feraheme 510 mg x 2 doses from 04/03/2022 and 04/10/2022.  -- Labs from 05/17/2022 showed anemia has resolved with Hgb 12.6, MCV 88.0. Iron panel showed iron 69, TIBC 388, saturation 18%, ferritin 126.  -- No additional IV iron required -- RTC in 3 months with repeat labs.   2. Fatigue: --Advised patient to follow up with PCP to evaluate for other symptoms including vitamin D deficiency, thyroid dysfunction and OSA  3. Nosebleeds: --Since patient has weekly nosebleeds that is chronic, we will place referral to ENT for further evaluation.    Orders Placed This Encounter  Procedures   Ambulatory referral to ENT    Referral Priority:   Routine    Referral Type:   Consultation    Referral Reason:   Specialty Services Required    Requested Specialty:   Otolaryngology    Number of Visits Requested:   1   Patient expressed understanding of the plan  provided.   I have spent a total of 25 minutes minutes of face-to-face and non-face-to-face time, preparing to see the patient, performing a medically appropriate examination, counseling and educating the patient, ordering tests/procedures, referring and communicating with other health care professionals, documenting clinical information in the electronic health record, and care coordination.   Georga Kaufmann PA-C Dept of Hematology and Oncology [facility]

## 2022-06-24 ENCOUNTER — Telehealth: Payer: Self-pay | Admitting: Diagnostic Neuroimaging

## 2022-06-24 NOTE — Telephone Encounter (Signed)
Received sleep referral from [facility]. Placed in sleep referrals box

## 2022-06-25 ENCOUNTER — Ambulatory Visit: Payer: Federal, State, Local not specified - PPO | Admitting: Gastroenterology

## 2022-08-01 ENCOUNTER — Institutional Professional Consult (permissible substitution): Payer: Federal, State, Local not specified - PPO | Admitting: Neurology

## 2022-08-01 ENCOUNTER — Telehealth: Payer: Self-pay | Admitting: Neurology

## 2022-08-01 NOTE — Telephone Encounter (Signed)
LVM and sent MyChart message informing pt of r/s needed for today's appt- MD out sick.

## 2022-08-12 ENCOUNTER — Encounter: Payer: Self-pay | Admitting: Hematology

## 2022-08-29 ENCOUNTER — Inpatient Hospital Stay: Payer: Federal, State, Local not specified - PPO | Attending: Hematology

## 2022-08-29 DIAGNOSIS — D509 Iron deficiency anemia, unspecified: Secondary | ICD-10-CM | POA: Insufficient documentation

## 2022-08-29 LAB — CBC WITH DIFFERENTIAL/PLATELET
Abs Immature Granulocytes: 0.01 10*3/uL (ref 0.00–0.07)
Basophils Absolute: 0.1 10*3/uL (ref 0.0–0.1)
Basophils Relative: 1 %
Eosinophils Absolute: 0.1 10*3/uL (ref 0.0–0.5)
Eosinophils Relative: 2 %
HCT: 40.4 % (ref 36.0–46.0)
Hemoglobin: 13.3 g/dL (ref 12.0–15.0)
Immature Granulocytes: 0 %
Lymphocytes Relative: 46 %
Lymphs Abs: 3.1 10*3/uL (ref 0.7–4.0)
MCH: 30.3 pg (ref 26.0–34.0)
MCHC: 32.9 g/dL (ref 30.0–36.0)
MCV: 92 fL (ref 80.0–100.0)
Monocytes Absolute: 0.4 10*3/uL (ref 0.1–1.0)
Monocytes Relative: 7 %
Neutro Abs: 3 10*3/uL (ref 1.7–7.7)
Neutrophils Relative %: 44 %
Platelets: 319 10*3/uL (ref 150–400)
RBC: 4.39 MIL/uL (ref 3.87–5.11)
RDW: 11.8 % (ref 11.5–15.5)
WBC: 6.7 10*3/uL (ref 4.0–10.5)
nRBC: 0 % (ref 0.0–0.2)

## 2022-08-29 LAB — FERRITIN: Ferritin: 103 ng/mL (ref 11–307)

## 2022-08-29 LAB — IRON AND TIBC
Iron: 82 ug/dL (ref 28–170)
Saturation Ratios: 19 % (ref 10.4–31.8)
TIBC: 429 ug/dL (ref 250–450)
UIBC: 347 ug/dL

## 2022-09-05 ENCOUNTER — Inpatient Hospital Stay: Payer: Federal, State, Local not specified - PPO | Attending: Hematology | Admitting: Oncology

## 2022-09-05 VITALS — BP 117/69 | HR 91 | Temp 98.0°F | Resp 18 | Wt 201.1 lb

## 2022-09-05 DIAGNOSIS — D509 Iron deficiency anemia, unspecified: Secondary | ICD-10-CM | POA: Diagnosis present

## 2022-09-05 DIAGNOSIS — R5383 Other fatigue: Secondary | ICD-10-CM | POA: Insufficient documentation

## 2022-09-05 NOTE — Progress Notes (Signed)
[facility] [address] [place] [address]   Clinic Day:  09/05/2022  Referring physician: Roger Kill, *  Patient Care Team: Clemetine Marker as PCP - General (Family Medicine) Doreatha Massed, MD as Medical Oncologist (Hematology)   CHIEF COMPLAINT/PURPOSE OF CONSULT:   Diagnosis: Iron deficiency anemia  Current Therapy: Feraheme 510 mg x 2 doses  HISTORY OF PRESENT ILLNESS:   Toney is a 32 y.o. female presenting to clinic today for f/u for iron deficiency anemia. She was last seen on 04/242024.   She received IV feraheme 510 mg x 2 doses from 04/03/2022 and 04/10/2022.  Denies any interval hospitalizations. Feels exhausted everyday and is still undergoing workup with her primary care doctor for her extensive fatigue.  Has lab work and a sleep study coming up in the next couple of weeks. Has a constant headaches. Has occasaionl dizziness and nausea.  Take Zofran and Phenergan as needed for nausea.  Denies any vomiting.  No improvement with energy levels after receiving iron. Does not take iron tablet d/t GI upset. No bleeding. BM's appear normal.   Unclear as to why she is iron deficient.  Denies heavy menstrual cycles.  Has Nexplanon and rarely has cycles. Had a colonoscopy on 04/18/2022 which was reported within normal limits.   PAST MEDICAL HISTORY:   Past Medical History: Past Medical History:  Diagnosis Date   Anxiety    Celiac disease sept 2015   Depression    Endometriosis    GERD (gastroesophageal reflux disease)    IBS (irritable bowel syndrome)    PCOS (polycystic ovarian syndrome) 2013   Pelvic pain in female    Wears contact lenses     Surgical History: Past Surgical History:  Procedure Laterality Date   BIOPSY  11/21/2021   Procedure: BIOPSY;  Surgeon: Lanelle Bal, DO;  [facility]: [facility];  Service: Endoscopy;;   COLONOSCOPY WITH PROPOFOL N/A 04/18/2022   Procedure: COLONOSCOPY WITH PROPOFOL;   Surgeon: Lanelle Bal, DO;  [facility]: [facility];  Service: Endoscopy;  Laterality: N/A;  10:30 AM, ASA 2   DILATION AND EVACUATION N/A 07/09/2012   Procedure: DILATATION AND EVACUATION;  Surgeon: Meriel Pica, MD;  Location: [facility];  Service: Gynecology;  Laterality: N/A;   ESOPHAGOGASTRODUODENOSCOPY (EGD) WITH PROPOFOL N/A 11/21/2021   Procedure: ESOPHAGOGASTRODUODENOSCOPY (EGD) WITH PROPOFOL;  Surgeon: Lanelle Bal, DO;  Location: [facility];  Service: Endoscopy;  Laterality: N/A;  12:45pm, asa 2   LAPAROSCOPY N/A 09/07/2014   Procedure: LAPAROSCOPY DIAGNOSTIC;  Surgeon: Richarda Overlie, MD;  Location: [facility];  Service: Gynecology;  Laterality: N/A;   WISDOM TOOTH EXTRACTION      Social History: Social History   Socioeconomic History   Marital status: Married    Spouse name: Not on file   Number of children: Not on file   Years of education: Not on file   Highest education level: Not on file  Occupational History   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 [place]  Tobacco Use   Smoking status: Never   Smokeless tobacco: Never  Vaping Use   Vaping status: Never Used  Substance and Sexual Activity   Alcohol use: Not Currently    Comment: none since 2022   Drug use: No   Sexual activity: Yes    Birth control/protection: Implant  Other Topics Concern   Not on file  Social History Narrative   Oct 2023: 2 kids, ages  61 year old girl and 85-year-old boy   Social Determinants of Health   Financial Resource Strain: Low Risk  (08/14/2020)   Received from [facility] prior to 04/06/2022., [facility] visits prior to 04/06/2022.   Overall Financial Resource Strain (CARDIA)    Difficulty of Paying Living Expenses: Not hard at all  Food Insecurity: Low Risk  (06/20/2022)   [facility]   Food vital sign    Within the past 12 months, you worried that your food would run out  before you got money to buy more: Never true    Within the past 12 months, the food you bought just didn't last and you didn't have money to get more. : Never true  Transportation Needs: No Transportation Needs (06/20/2022)   [facility]   Transportation    In the past 12 months, has lack of reliable transportation kept you from medical appointments, meetings, work or from getting things needed for daily living? : No  Physical Activity: Inactive (08/14/2020)   [facility] prior to 04/06/2022., [facility] prior to 04/06/2022.   Exercise Vital Sign    Days of Exercise per Week: 0 days    Minutes of Exercise per Session: 0 min  Stress: Stress Concern Present (08/14/2020)   [facility] prior to 04/06/2022., [facility] visits prior to 04/06/2022.   [employer] of Occupational Health - Occupational Stress Questionnaire    Feeling of Stress : Very much  Social Connections: Moderately Isolated (08/14/2020)   [facility] visits prior to 04/06/2022., [facility] visits prior to 04/06/2022.   Social Advertising account executive [NHANES]    Frequency of Communication with Friends and Family: Once a week    Frequency of Social Gatherings with Friends and Family: Twice a week    Attends Religious Services: Never    Database administrator or Organizations: No    Attends Banker Meetings: Never    Marital Status: Married  Catering manager Violence: Not At Risk (04/01/2022)   Humiliation, Afraid, Rape, and Kick questionnaire    Fear of Current or Ex-Partner: No    Emotionally Abused: No    Physically Abused: No    Sexually Abused: No    Family History: Family History  Problem Relation Age of Onset   Depression Mother    Alcohol abuse Mother    Crohn's disease Mother     Colon polyps Father    Diabetes Maternal Grandfather    Diabetes Paternal Grandmother    Bipolar disorder Maternal Aunt    Alcohol abuse Maternal Uncle    Alcohol abuse Maternal Uncle    Alcohol abuse Maternal Uncle    Alcohol abuse Maternal Uncle    Colon cancer Neg Hx        great grandfather had colon cancer but no first degree relatives    Current Medications:  Current Outpatient Medications:    buPROPion (WELLBUTRIN XL) 300 MG 24 hr tablet, Take 300 mg by mouth daily., Disp: , Rfl:    esomeprazole (NEXIUM) 40 MG capsule, Take 1 capsule (40 mg total) by mouth daily before breakfast., Disp: 90 capsule, Rfl: 3   etonogestrel (NEXPLANON) 68 MG IMPL implant, 68 mg by Subdermal route once., Disp: , Rfl:    FLUoxetine (  PROZAC) 20 MG tablet, Take 20 mg by mouth daily., Disp: , Rfl:    hydrOXYzine (ATARAX) 25 MG tablet, Take 25 mg by mouth 3 (three) times daily as needed for anxiety., Disp: , Rfl:    loperamide (IMODIUM) 2 MG capsule, , Disp: , Rfl:    ondansetron (ZOFRAN-ODT) 4 MG disintegrating tablet, , Disp: , Rfl:    promethazine (PHENERGAN) 12.5 MG tablet, Take 1 tablet (12.5 mg total) by mouth every 8 (eight) hours as needed for nausea or vomiting. May take 2 tablets if needed every 8 hours., Disp: 30 tablet, Rfl: 0   propranolol (INDERAL) 20 MG tablet, Take 20 mg by mouth 2 (two) times daily., Disp: , Rfl:    Rimegepant Sulfate (NURTEC) 75 MG TBDP, Take 75 mg by mouth daily as needed (migraines)., Disp: , Rfl:    Semaglutide,0.25 or 0.5MG /DOS, (OZEMPIC, 0.25 OR 0.5 MG/DOSE,) 2 MG/1.5ML SOPN, 2 mg every week by sub-q route., Disp: , Rfl:    tizanidine (ZANAFLEX) 2 MG capsule, Take 2 mg by mouth 3 (three) times daily as needed for muscle spasms., Disp: , Rfl:    topiramate (TOPAMAX) 25 MG tablet, Take 50 mg by mouth 2 (two) times daily., Disp: , Rfl:    Allergies: Allergies  Allergen Reactions   Tegaderm Ag Mesh [Silver] Other (See Comments)    Skin irritation and itching    Tuberculin Ppd Hives and Swelling    Skin Test    Wound Dressing Adhesive     Skin irritation and itching   Sulfa Antibiotics Rash    Childhood reaction    REVIEW OF SYSTEMS:   Review of Systems  Constitutional:  Positive for appetite change and fatigue. Negative for fever and unexpected weight change.  HENT:   Negative for nosebleeds, sore throat and trouble swallowing.   Eyes: Negative.   Respiratory:  Positive for chest tightness. Negative for cough, shortness of breath and wheezing.   Cardiovascular: Negative.  Negative for chest pain and leg swelling.  Gastrointestinal:  Positive for nausea. Negative for abdominal pain, blood in stool, constipation, diarrhea and vomiting.  Endocrine: Negative.   Genitourinary: Negative.  Negative for bladder incontinence, hematuria and nocturia.   Musculoskeletal: Negative.  Negative for back pain and flank pain.  Skin: Negative.   Neurological:  Positive for dizziness, headaches and numbness. Negative for light-headedness.  Hematological: Negative.   Psychiatric/Behavioral: Negative.  Negative for confusion. The patient is not nervous/anxious.      VITALS:   There were no vitals taken for this visit.  Wt Readings from Last 3 Encounters:  05/30/22 212 lb 14.4 oz (96.6 kg)  04/19/22 205 lb (93 kg)  04/18/22 205 lb (93 kg)    There is no height or weight on file to calculate BMI.   PHYSICAL EXAM:   Physical Exam Vitals reviewed.  Constitutional:      Appearance: Normal appearance.  Cardiovascular:     Rate and Rhythm: Normal rate and regular rhythm.     Heart sounds: Normal heart sounds.  Pulmonary:     Effort: Pulmonary effort is normal.     Breath sounds: Normal breath sounds.  Abdominal:     Palpations: There is no hepatomegaly or splenomegaly.  Musculoskeletal:     Right lower leg: No edema.     Left lower leg: No edema.  Lymphadenopathy:     Cervical: Cervical adenopathy present.     Upper Body:     Right upper body:  No supraclavicular adenopathy.  Left upper body: No supraclavicular adenopathy.  Neurological:     General: No focal deficit present.     Mental Status: She is alert and oriented to person, place, and time.  Psychiatric:        Mood and Affect: Mood normal.        Behavior: Behavior normal.     LABS:      Latest Ref Rng & Units 08/29/2022    2:25 PM 05/17/2022    2:13 PM 04/19/2022    4:06 AM  CBC  WBC 4.0 - 10.5 K/uL 6.7  7.6  8.2   Hemoglobin 12.0 - 15.0 g/dL 16.1  09.6  04.5   Hematocrit 36.0 - 46.0 % 40.4  40.3  33.6   Platelets 150 - 400 K/uL 319  310  210       Latest Ref Rng & Units 04/19/2022    4:06 AM 11/18/2021    6:32 PM  CMP  Glucose 70 - 99 mg/dL 409  811   BUN 6 - 20 mg/dL 13  10   Creatinine 9.14 - 1.00 mg/dL 7.82  9.56   Sodium 213 - 145 mmol/L 132  135   Potassium 3.5 - 5.1 mmol/L 3.0  3.5   Chloride 98 - 111 mmol/L 105  105   CO2 22 - 32 mmol/L 17  23   Calcium 8.9 - 10.3 mg/dL 8.2  9.2   Total Protein 6.5 - 8.1 g/dL 6.7  7.9   Total Bilirubin 0.3 - 1.2 mg/dL 0.4  0.6   Alkaline Phos 38 - 126 U/L 70  57   AST 15 - 41 U/L 54  31   ALT 0 - 44 U/L 46  31      No results found for: "CEA1", "CEA" / No results found for: "CEA1", "CEA" No results found for: "PSA1" No results found for: "YQM578" No results found for: "CAN125"  No results found for: "TOTALPROTELP", "ALBUMINELP", "A1GS", "A2GS", "BETS", "BETA2SER", "GAMS", "MSPIKE", "SPEI" Lab Results  Component Value Date   TIBC 429 08/29/2022   TIBC 388 05/17/2022   TIBC 527 (H) 11/20/2021   FERRITIN 103 08/29/2022   FERRITIN 126 05/17/2022   FERRITIN 5 (L) 11/20/2021   IRONPCTSAT 19 08/29/2022   IRONPCTSAT 18 05/17/2022   IRONPCTSAT 7 (L) 11/20/2021   No results found for: "LDH"   STUDIES:   No results found.    ASSESSMENT & PLAN:   Assessment:  1.  Iron deficiency anemia: - History of celiac disease diagnosed by [facility] over 5 years ago, recent EGD in October 2023 with benign  duodenal biopsy and serologies negative - On iron tablet daily for the last 15-16 years.  Gets constipated and has abdominal pain and nausea from its so discontinued PO iron.   2.  Social/family history: -Lives at home with her husband.  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 - Non-smoker. - No family history of severe anemia.  Paternal great grandfather had colon cancer.  Paternal cousins had bladder cancer.  Her mother's twin sister had breast cancer.  Maternal cousin has leukemia.  Maternal great uncle also had leukemia.  Plan: Iron deficiency anemia: - Received IV feraheme 510 mg x 2 doses from 04/03/2022 and 04/10/2022.  -- Labs from 08/29/22 showed anemia has resolved with Hgb 13.3, MCV 92.0. Iron panel showed iron 82, TIBC 429, saturation 19%, ferritin 103.  -- No additional IV iron required -- RTC in 6 months with repeat  labs and assessment.   2. Fatigue: -Continue workup with PCP.  She is scheduled for a sleep study and additional lab work on 09/16/2022.  3. Nosebleeds: -She is previously referred to ENT but canceled the appointment because nosebleeds have slowed down.  PLAN SUMMARY: >> No additional IV iron today. >> RTC in 6 months for f/u with labs and see provider.      I spent 20 minutes dedicated to the care of this patient (face-to-face and non-face-to-face) on the date of the encounter to include what is described in the assessment and plan.  Durenda Hurt, NP 09/05/2022 8:36 AM

## 2022-09-12 ENCOUNTER — Encounter: Payer: Self-pay | Admitting: *Deleted

## 2022-09-16 ENCOUNTER — Institutional Professional Consult (permissible substitution): Payer: Federal, State, Local not specified - PPO | Admitting: Neurology

## 2022-09-26 ENCOUNTER — Encounter: Payer: Self-pay | Admitting: Neurology

## 2022-09-26 ENCOUNTER — Institutional Professional Consult (permissible substitution): Payer: Federal, State, Local not specified - PPO | Admitting: Neurology

## 2022-09-26 VITALS — BP 115/69 | HR 85 | Ht 64.0 in | Wt 203.0 lb

## 2022-09-26 DIAGNOSIS — G4710 Hypersomnia, unspecified: Secondary | ICD-10-CM | POA: Diagnosis not present

## 2022-09-26 DIAGNOSIS — E669 Obesity, unspecified: Secondary | ICD-10-CM

## 2022-09-26 DIAGNOSIS — R6889 Other general symptoms and signs: Secondary | ICD-10-CM | POA: Diagnosis not present

## 2022-09-26 DIAGNOSIS — R400 Somnolence: Secondary | ICD-10-CM | POA: Diagnosis not present

## 2022-09-26 DIAGNOSIS — G478 Other sleep disorders: Secondary | ICD-10-CM | POA: Diagnosis not present

## 2022-09-26 DIAGNOSIS — R519 Headache, unspecified: Secondary | ICD-10-CM

## 2022-09-26 NOTE — Progress Notes (Signed)
Subjective:    Patient ID: Stephanie Acosta is a 32 y.o. female.  HPI    Huston Foley, MD, PhD [facility] [address], [address] [address] [place], [place] [address]  Dear [name],  I saw your patient, Stephanie Acosta, upon your kind request in my sleep clinic today for initial consultation of her sleep disorder, in particular, concern for underlying obstructive sleep apnea.  The patient is unaccompanied today.  As you know, Ms. Tetreault is a 32 year old female with an underlying medical history of diabetes, iron deficiency anemia, vitamin B12 and vitamin D deficiencies, celiac disease, endometriosis, anxiety, depression, reflux disease, headaches, irritable bowel syndrome, PCOS, palpitations, scoliosis, and obesity, who reports significant daytime somnolence for the past several years.  She has had daytime sleepiness for at least 12 years, first noticed significant somnolence when she was pregnant with her daughter.  She is not aware of any snoring.  She has vivid dreams, she also dreams when she naps.  She has had rare instances of sleep paralysis but denies any telltale symptoms of cataplexy.  She may have had hypnopompic hallucinations.  She is never fully rested and even when she tries to get enough sleep, she can still nap during the day.  She is sleepy at the wheel and avoids long distance driving, they do have a 60-[id number] commute but since her husband works with her at the same workplace, he usually does the driving.  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  Epworth sleepiness score is 17 out of 24, fatigue severity score is 63 out of 63.  Bedtime is generally between 9 and 10 and rise time around 6 AM.  Left to her own devices she can easily sleep 10 to 12 hours.  She lives with her family including husband and 34 year old and 64-year-old.  She has had recurrent morning headaches, denies nightly nocturia.  Her mom is sleepy during the day but no one has been  formally diagnosed with sleep apnea in her family or sleepiness condition.  She is a non-smoker.  She drinks caffeine once or twice a week, she drinks alcohol less than once a month. I reviewed your office note from 06/20/2022.  She had recent blood work through [facility] on 09/23/2022.  Vitamin B12 was on the lower end of the spectrum at 251, Vitamin D slightly below normal at 28.9, TSH in the normal range at 1.36, A1c was in normal range at 5.6, Lyme disease antibody negative.  Iron studies were not tested in July 2024, she had a normal ferritin level at 103, iron and TIBC were unremarkable.  Of note, she has recently taken herself off of multiple medications, these include Wellbutrin, Focalin, Nexium, Prozac, hydroxyzine, Imodium, Inderal, and Topamax.  She has not noticed any improvement in her daytime somnolence since then.  She admits that she took herself off of these medications but made her providers aware.  She sees psychiatry as well.  Her Past Medical History Is Significant For: Past Medical History:  Diagnosis Date   Anxiety    Celiac disease 10/2013   Depression    Endometriosis    Fatigue    GERD (gastroesophageal reflux disease)    Headache syndrome    IBS (irritable bowel syndrome)    Iron deficiency anemia    Migraine    Palpitations    PCOS (polycystic ovarian syndrome) 2013   Pelvic pain in female    Scoliosis    Seasonal affective  disorder (HCC)    Type 2 diabetes mellitus without complication, without long-term current use of insulin (HCC)    Wears contact lenses     Her Past Surgical History Is Significant For: Past Surgical History:  Procedure Laterality Date   BIOPSY  11/21/2021   Procedure: BIOPSY;  Surgeon: Lanelle Bal, DO;  [facility]: [facility];  Service: Endoscopy;;   CHOLECYSTECTOMY     COLONOSCOPY WITH PROPOFOL N/A 04/18/2022   Procedure: COLONOSCOPY WITH PROPOFOL;  Surgeon: Lanelle Bal, DO;  [facility];  Service:  Endoscopy;  Laterality: N/A;  10:30 AM, ASA 2   DILATION AND EVACUATION N/A 07/09/2012   Procedure: DILATATION AND EVACUATION;  Surgeon: Meriel Pica, MD;  Location: [facility];  Service: Gynecology;  Laterality: N/A;   ESOPHAGOGASTRODUODENOSCOPY (EGD) WITH PROPOFOL N/A 11/21/2021   Procedure: ESOPHAGOGASTRODUODENOSCOPY (EGD) WITH PROPOFOL;  Surgeon: Lanelle Bal, DO;  Location: [facility];  Service: Endoscopy;  Laterality: N/A;  12:45pm, asa 2   LAPAROSCOPY N/A 09/07/2014   Procedure: LAPAROSCOPY DIAGNOSTIC;  Surgeon: Richarda Overlie, MD;  Location: [facility];  Service: Gynecology;  Laterality: N/A;   WISDOM TOOTH EXTRACTION      Her Family History Is Significant For: Family History  Problem Relation Age of Onset   Hypertension Mother    Depression Mother    Alcohol abuse Mother    Crohn's disease Mother    Colon polyps Father    High Cholesterol Father    Diabetes Maternal Grandfather    Diabetes Paternal Grandmother    Colon cancer Paternal Grandfather    Bipolar disorder Maternal Aunt    Breast cancer Maternal Aunt    Alcohol abuse Maternal Uncle    Alcohol abuse Maternal Uncle    Alcohol abuse Maternal Uncle    Alcohol abuse Maternal Uncle    Diabetes Other        "everyone"   Sleep apnea Neg Hx     Her Social History Is Significant For: Social History   Socioeconomic History   Marital status: Married    Spouse name: Not on file   Number of children: Not on file   Years of education: Not on file   Highest education level: Not on file  Occupational History   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 [place]  Tobacco Use   Smoking status: Never   Smokeless tobacco: Never  Vaping Use   Vaping status: Never Used  Substance and Sexual Activity   Alcohol use: Yes    Comment: <1 per month   Drug use: Never   Sexual activity: Yes    Birth control/protection: Implant  Other Topics Concern   Not on file  Social History Narrative   Oct 2023: 2  kids, ages 14 year old girl and 72-year-old boy      Update 09/26/2022   Caffeine: 1-2 drinks/week   Left handed   Lives at home with husband and kids   Social Determinants of Health   Financial Resource Strain: Low Risk  (08/14/2020)   [facility] from [facility] visits prior to 04/06/2022., [facility] visits prior to 04/06/2022.   Overall Financial Resource Strain (CARDIA)    Difficulty of Paying Living Expenses: Not hard at all  Food Insecurity: Low Risk  (09/23/2022)   [facility]   Food vital sign    Within the past 12 months, you worried that your food would run out before you  got money to buy more: Never true    Within the past 12 months, the food you bought just didn't last and you didn't have money to get more. : Never true  Transportation Needs: Not on file (09/23/2022)  Physical Activity: Inactive (08/14/2020)   [facility] 04/06/2022., [facility] visits prior to 04/06/2022.   Exercise Vital Sign    Days of Exercise per Week: 0 days    Minutes of Exercise per Session: 0 min  Stress: Stress Concern Present (08/14/2020)   [facility] prior to 04/06/2022., [facility] visits prior to 04/06/2022.   [employer] of Occupational Health - Occupational Stress Questionnaire    Feeling of Stress : Very much  Social Connections: Moderately Isolated (08/14/2020)   Received from [facility] visits prior to 04/06/2022., [facility] visits prior to 04/06/2022.   Social Advertising account executive [NHANES]    Frequency of Communication with Friends and Family: Once a week    Frequency of Social Gatherings with Friends and Family: Twice a week    Attends Religious Services: Never    Database administrator or Organizations: No    Attends Museum/gallery exhibitions officer: Never    Marital Status: Married    Her Allergies Are:  Allergies  Allergen Reactions   Tegaderm Ag Mesh [Silver] Other (See Comments)    Skin irritation and itching   Tuberculin Ppd Hives and Swelling    Skin Test    Wound Dressing Adhesive     Skin irritation and itching   Sulfa Antibiotics Rash    Childhood reaction  :   Her Current Medications Are:  Outpatient Encounter Medications as of 09/26/2022  Medication Sig   etonogestrel (NEXPLANON) 68 MG IMPL implant 68 mg by Subdermal route once.   ondansetron (ZOFRAN-ODT) 4 MG disintegrating tablet as needed.   promethazine (PHENERGAN) 12.5 MG tablet Take 1 tablet (12.5 mg total) by mouth every 8 (eight) hours as needed for nausea or vomiting. May take 2 tablets if needed every 8 hours.   Rimegepant Sulfate (NURTEC) 75 MG TBDP Take 75 mg by mouth daily as needed (migraines).   scopolamine (TRANSDERM-SCOP) 1 MG/3DAYS as needed.   Semaglutide,0.25 or 0.5MG /DOS, (OZEMPIC, 0.25 OR 0.5 MG/DOSE,) 2 MG/1.5ML SOPN 2 mg every week by sub-q route.   tizanidine (ZANAFLEX) 2 MG capsule Take 2 mg by mouth 3 (three) times daily as needed for muscle spasms.   buPROPion (WELLBUTRIN XL) 300 MG 24 hr tablet Take 300 mg by mouth daily. (Patient not taking: Reported on 09/05/2022)   dexmethylphenidate (FOCALIN XR) 10 MG 24 hr capsule Take 10 mg by mouth every morning. (Patient not taking: Reported on 09/05/2022)   esomeprazole (NEXIUM) 40 MG capsule Take 1 capsule (40 mg total) by mouth daily before breakfast. (Patient not taking: Reported on 09/05/2022)   FLUoxetine (PROZAC) 40 MG capsule Take 40 mg by mouth every morning. (Patient not taking: Reported on 09/05/2022)   hydrOXYzine (ATARAX) 25 MG tablet Take 25 mg by mouth 3 (three) times daily as needed for anxiety. (Patient not taking: Reported on 09/05/2022)   loperamide (IMODIUM) 2 MG capsule  (Patient not taking: Reported on 09/05/2022)   propranolol (INDERAL) 20 MG tablet Take 20 mg by  mouth 2 (two) times daily. (Patient not taking: Reported on 09/05/2022)   topiramate (TOPAMAX) 25 MG tablet  Take 50 mg by mouth 2 (two) times daily. (Patient not taking: Reported on 09/05/2022)   No facility-administered encounter medications on file as of 09/26/2022.  :   Review of Systems:  Out of a complete 14 point review of systems, all are reviewed and negative with the exception of these symptoms as listed below:  Review of Systems  Neurological:        Patient is here alone for sleep consult. She endorses fatigue, sleepiness, palpitations, chest pain, headaches. She doesn't think she snores. She has never had a sleep study before. No known family history of sleep apnea.     Objective:  Neurological Exam  Physical Exam Physical Examination:   Vitals:   09/26/22 0830  BP: 115/69  Pulse: 85    General Examination: The patient is a very pleasant 32 y.o. female in no acute distress. She appears well-developed and well-nourished and well groomed.   HEENT: Normocephalic, atraumatic, pupils are equal, round and reactive to light, extraocular tracking is good without limitation to gaze excursion or nystagmus noted. Hearing is grossly intact. Face is symmetric with normal facial animation. Speech is clear with no dysarthria noted. There is no hypophonia. There is no lip, neck/head, jaw or voice tremor. Neck is supple with full range of passive and active motion. There are no carotid bruits on auscultation. Oropharynx exam reveals: No significant mouth dryness, good dental hygiene, mild airway crowding secondary to tonsillar size of 1-2+ on the right and 1+ on the left, Mallampati class II.  Neck circumference 14 three-quarter inches, minimal overbite noted.  Tongue protrudes centrally and palate elevates symmetrically.   Chest: Clear to auscultation without wheezing, rhonchi or crackles noted.  Heart: S1+S2+0, regular and normal without murmurs, rubs or gallops noted.   Abdomen: Soft,  non-tender and non-distended.  Extremities: There is no pitting edema in the distal lower extremities bilaterally.   Skin: Warm and dry without trophic changes noted.   Musculoskeletal: exam reveals no obvious joint deformities.   Neurologically:  Mental status: The patient is awake, alert and oriented in all 4 spheres. Her immediate and remote memory, attention, language skills and fund of knowledge are appropriate. There is no evidence of aphasia, agnosia, apraxia or anomia. Speech is clear with normal prosody and enunciation. Thought process is linear. Mood is normal and affect is normal.  Cranial nerves II - XII are as described above under HEENT exam.  Motor exam: Normal bulk, strength and tone is noted. There is no obvious action or resting tremor.  Fine motor skills and coordination: grossly intact.  Cerebellar testing: No dysmetria or intention tremor. There is no truncal or gait ataxia.  Sensory exam: intact to light touch in the upper and lower extremities.  Gait, station and balance: She stands easily. No veering to one side is noted. No leaning to one side is noted. Posture is age-appropriate and stance is narrow based. Gait shows normal stride length and normal pace. No problems turning are noted.   Assessment and plan:   In summary, KOLBEY CHRISTOFF is a very pleasant 32 y.o.-year old female 32 year old female with an underlying medical history of diabetes, iron deficiency anemia, vitamin B12 and vitamin D deficiencies, celiac disease, endometriosis, anxiety, depression, reflux disease, headaches, irritable bowel syndrome, PCOS, palpitations, scoliosis, and obesity, who presents for evaluation of her several year history of significant daytime somnolence.  Her history is not compelling for underlying sleep disordered breathing.  She may have an underlying hypersomnolence disorder with the  main differential diagnosis of narcolepsy without cataplexy (by history) versus idiopathic  hypersomnolence.  I talked to the patient at length today.  This was a lengthy appointment of over 60 minutes as we addressed multiple diagnostic possibilities and treatment options.  She is advised to proceed with extended sleep testing in the form of a nocturnal polysomnogram with next day nap testing (called MSLT).  In preparation for her testing she would have to be off of any psychotropic medications, stimulant, narcotic pain medications.  She is currently not on any medications that need to be tapered off.  She has taken herself off of multiple psychotropic and neurotropic medications lately.  She is advised to avoid taking tizanidine before her sleep studies.  She is willing to proceed with sleep testing.  If her overnight polysomnogram reveals sleep disordered breathing such as obstructive sleep apnea we will proceed with treatment and likely consider a positive airway pressure device such as AutoPap or CPAP.  We talked about different symptomatic treatment options for hypersomnolence conditions and will certainly pick up our discussion after testing.  I explained the sleep test procedures to the patient in detail today.  I answered all her questions today and she was in agreement with our plan.  Thank you very much for allowing me to participate in the care of this nice patient. If I can be of any further assistance to you please do not hesitate to call me at [phone].  Sincerely,   Huston Foley, MD, PhD

## 2022-09-26 NOTE — Patient Instructions (Addendum)
Thank you for choosing [facility] for your sleep related care! It was nice to meet you today!   Here is what we discussed today:    Based on your symptoms and your exam I believe you are at risk for an underlying sleepiness condition such as narcolepsy or idiopathic hypersomnolence.    We will proceed with a sleep study overnight in our sleep lab with a next day nap test.  As explained, please do not add any new depression or anxiety medication or stimulant medication to your medication regimen in preparation for your sleep testing.  We should be able to call you once we have insurance approval.  Please do not drive when feeling sleepy.    If your nighttime sleep study shows obstructive sleep apnea I will likely recommend that we treat you for obstructive sleep apnea first.     Please note that untreated obstructive sleep apnea may carry additional perioperative morbidity. Patients with significant obstructive sleep apnea (typically, in the moderate to severe degree) should receive, if possible, perioperative PAP (positive airway pressure) therapy and the surgeons and particularly the anesthesiologists should be informed of the diagnosis and the severity of the sleep disordered breathing.   We will call you or email you through MyChart with regards to your test results and plan a follow-up in sleep clinic accordingly. Most likely, you will hear from one of our nurses.   Our sleep lab administrative assistant will call you to schedule your sleep study and give you further instructions, regarding the check in process for the sleep study, arrival time, what to bring, when you can expect to leave after the study, etc., and to answer any other logistical questions you may have. If you don't hear back from her by about 2 weeks from now, please feel free to call her direct line at [phone] or you can call our general clinic number, or email [place] through My Chart.

## 2022-10-02 ENCOUNTER — Telehealth: Payer: Self-pay | Admitting: Neurology

## 2022-10-02 NOTE — Telephone Encounter (Signed)
[employer]/MS[employer] fed pending faxed notes

## 2022-10-10 NOTE — Telephone Encounter (Signed)
[employer]MS[employer] fed Berkley Harvey: [id number] (ep. 10/09/22 to 11/07/22)

## 2022-10-21 ENCOUNTER — Encounter: Payer: Self-pay | Admitting: Neurology

## 2022-11-18 ENCOUNTER — Encounter: Payer: Self-pay | Admitting: *Deleted

## 2022-11-19 ENCOUNTER — Encounter: Payer: Self-pay | Admitting: Neurology

## 2022-11-19 ENCOUNTER — Ambulatory Visit: Payer: Federal, State, Local not specified - PPO | Admitting: Neurology

## 2022-11-19 VITALS — BP 102/67 | HR 67 | Ht 60.0 in | Wt 199.0 lb

## 2022-11-19 DIAGNOSIS — G43019 Migraine without aura, intractable, without status migrainosus: Secondary | ICD-10-CM

## 2022-11-19 DIAGNOSIS — R519 Headache, unspecified: Secondary | ICD-10-CM

## 2022-11-19 DIAGNOSIS — G4489 Other headache syndrome: Secondary | ICD-10-CM

## 2022-11-19 NOTE — Patient Instructions (Addendum)
It was nice to see you again today.  I think your medication regimen with Emgality once a month injection and Ubrelvy as needed is appropriate for now. As discussed, we will proceed with a brain MRI with and without contrast to rule out a structural cause of your headaches. Please keep your appointment with your eye doctor and make sure you get a full, dilated eye exam.  We will plan to follow-up after your sleep studies which are scheduled for next month.

## 2022-11-19 NOTE — Addendum Note (Signed)
Addended by: Huston Foley on: 11/19/2022 02:26 PM   Modules accepted: Orders

## 2022-11-19 NOTE — Progress Notes (Addendum)
Subjective:    Patient ID: Stephanie Acosta is a 32 y.o. female.  HPI    Interim history:   Dear [name],  I saw your patient, Stephanie Acosta, upon your kind request and my neurologic clinic today for evaluation of her recurrent headaches.  The patient is unaccompanied today.  As you know, [name] is a 32 year old female with an underlying complex medical history of celiac disease, endometriosis, reflux disease, irritable bowel syndrome, vitamin D deficiency, vitamin B12 deficiency, anemia, migraine headaches, PCOS, palpitations, scoliosis, anxiety, depression, diabetes, and obesity, who reports a longstanding history of migraine headaches.  Describes different headache sensations including headaches associated with tingling, headaches with blurry vision, sometimes the headache is generalized, sometimes on the sides, sometimes starts in the back and radiates forward, sometimes she has pain.  She denies any telltale visual aura.  She has not had any sudden onset one-sided weakness or numbness or tingling or droopy face or slurring of speech but has had intermittent paresthesias in her fingertips.  She is no longer on Topamax, she stopped it a few months ago.  She has a longstanding history of ringing in the ears bilaterally, right more than left and perhaps hearing loss but has not been formally checked out with a hearing test or has seen ENT.  She has not had a brain MRI but would like to get 1.  Associated nausea, she has a prescription for Zofran and Phenergan and feels that the Phenergan is more effective than the Zofran and sometimes she takes scopolamine.  She tries to hydrate well.  She limits her caffeine to 1 serving or less per day.  She does not currently drink any alcohol.  She had 1 Emgality injection thus far last month and has tried [place] a few times but has not had much in the way of success thus far.  She has woken up with a headache as well.  She is scheduled for her formal eye  exam in [place] in December 2024.  Her last eye exam was in September 2023.  She reports that she has had a benign eye exam.   She has previously been on amitriptyline for migraine prevention and has also been on propranolol and Topamax.  For acute management she has tried sumatriptan and Nurtec in the past.  I reviewed your telemedicine visit note from 10/23/2022.  She was given a Medrol Dosepak at the time and started on Ubrelvy 50 mg daily as well as Emgality injections monthly.    I have recently evaluated her for daytime somnolence.  She is scheduled for a PSG and MSLT next month.  In preparation for her sleep testing she is supposed to stay off Wellbutrin, Focalin, Prozac, hydroxyzine, Zanaflex, and Topamax. She had taken herself off of most of her meds some months ago.   The patient's allergies, current medications, family history, past medical history, past social history, past surgical history and problem list were reviewed and updated as appropriate.   Previously:  09/26/2022: 32 year old female with an underlying medical history of diabetes, iron deficiency anemia, vitamin B12 and vitamin D deficiencies, celiac disease, endometriosis, anxiety, depression, reflux disease, headaches, irritable bowel syndrome, PCOS, palpitations, scoliosis, and obesity, who reports significant daytime somnolence for the past several years.  She has had daytime sleepiness for at least 12 years, first noticed significant somnolence when she was pregnant with her daughter.  She is not aware of any snoring.  She has vivid dreams, she also dreams when she naps.  She has had rare instances of sleep paralysis but denies any telltale symptoms of cataplexy.  She may have had hypnopompic hallucinations.  She is never fully rested and even when she tries to get enough sleep, she can still nap during the day.  She is sleepy at the wheel and avoids long distance driving, they do have a [employer] commute but since her  husband works with her at the same workplace, he usually does the driving.  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  Epworth sleepiness score is 17 out of 24, fatigue severity score is 63 out of 63.  Bedtime is generally between 9 and 10 and rise time around 6 AM.  Left to her own devices she can easily sleep 10 to 12 hours.  She lives with her family including husband and 77 year old and 62-year-old.  She has had recurrent morning headaches, denies nightly nocturia.  Her mom is sleepy during the day but no one has been formally diagnosed with sleep apnea in her family or sleepiness condition.  She is a non-smoker.  She drinks caffeine once or twice a week, she drinks alcohol less than once a month. I reviewed your office note from 06/20/2022.   She had recent blood work through [facility] on 09/23/2022.  Vitamin B12 was on the lower end of the spectrum at 251, Vitamin D slightly below normal at 28.9, TSH in the normal range at 1.36, A1c was in normal range at 5.6, Lyme disease antibody negative.  Iron studies were not tested in July 2024, she had a normal ferritin level at 103, iron and TIBC were unremarkable.   Of note, she has recently taken herself off of multiple medications, these include Wellbutrin, Focalin, Nexium, Prozac, hydroxyzine, Imodium, Inderal, and Topamax.  She has not noticed any improvement in her daytime somnolence since then.  She admits that she took herself off of these medications but made her providers aware.  She sees psychiatry as well.  Her Past Medical History Is Significant For: Past Medical History:  Diagnosis Date   Anxiety    Celiac disease 10/2013   Depression    Endometriosis    Fatigue    GERD (gastroesophageal reflux disease)    Headache syndrome    IBS (irritable bowel syndrome)    Iron deficiency anemia    Migraine    Palpitations    PCOS (polycystic ovarian syndrome) 2013   Pelvic pain in female    Scoliosis    Seasonal affective disorder (HCC)     Type 2 diabetes mellitus without complication, without long-term current use of insulin (HCC)    Wears contact lenses     Her Past Surgical History Is Significant For: Past Surgical History:  Procedure Laterality Date   BIOPSY  11/21/2021   Procedure: BIOPSY;  Surgeon: Lanelle Bal, DO;  Location: [facility];  Service: Endoscopy;;   CHOLECYSTECTOMY     COLONOSCOPY WITH PROPOFOL N/A 04/18/2022   Procedure: COLONOSCOPY WITH PROPOFOL;  Surgeon: Lanelle Bal, DO;  Location: [facility];  Service: Endoscopy;  Laterality: N/A;  10:30 AM, ASA 2   DILATION AND EVACUATION N/A 07/09/2012   Procedure: DILATATION AND EVACUATION;  Surgeon: Meriel Pica, MD;  Location: [facility];  Service: Gynecology;  Laterality: N/A;   ESOPHAGOGASTRODUODENOSCOPY (EGD) WITH PROPOFOL N/A 11/21/2021   Procedure: ESOPHAGOGASTRODUODENOSCOPY (EGD) WITH PROPOFOL;  Surgeon: Lanelle Bal, DO;  Location: [facility];  Service: Endoscopy;  Laterality: N/A;  12:45pm,  asa 2   LAPAROSCOPY N/A 09/07/2014   Procedure: LAPAROSCOPY DIAGNOSTIC;  Surgeon: Richarda Overlie, MD;  Location: [facility];  Service: Gynecology;  Laterality: N/A;   WISDOM TOOTH EXTRACTION      Her Family History Is Significant For: Family History  Problem Relation Age of Onset   Hypertension Mother    Depression Mother    Alcohol abuse Mother    Crohn's disease Mother    Colon polyps Father    High Cholesterol Father    Diabetes Maternal Grandfather    Diabetes Paternal Grandmother    Colon cancer Paternal Grandfather    Bipolar disorder Maternal Aunt    Breast cancer Maternal Aunt    Alcohol abuse Maternal Uncle    Alcohol abuse Maternal Uncle    Alcohol abuse Maternal Uncle    Alcohol abuse Maternal Uncle    Diabetes Other        "everyone"   Sleep apnea Neg Hx     Her Social History Is Significant For: Social History   Socioeconomic History   Marital status: Married    Spouse name: Not on file    Number of children: Not on file   Years of education: Not on file   Highest education level: Not on file  Occupational History   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 [place]  Tobacco Use   Smoking status: Never   Smokeless tobacco: Never  Vaping Use   Vaping status: Never Used  Substance and Sexual Activity   Alcohol use: Yes    Comment: <1 per month   Drug use: Never   Sexual activity: Yes    Birth control/protection: Implant  Other Topics Concern   Not on file  Social History Narrative   Oct 2023: 2 kids, ages 8 year old girl and 46-year-old boy      Update 09/26/2022   Caffeine: 1-2 drinks/week   Left handed   Lives at home with husband and kids   Social Determinants of Health   Financial Resource Strain: Low Risk  (08/14/2020)   [facility] visits prior to 04/06/2022., [facility] visits prior to 04/06/2022.   Overall Financial Resource Strain (CARDIA)    Difficulty of Paying Living Expenses: Not hard at all  Food Insecurity: Low Risk  (10/23/2022)   [facility]   Hunger Vital Sign    Worried About Running Out of Food in the Last Year: Never true    Ran Out of Food in the Last Year: Never true  Transportation Needs: No Transportation Needs (10/23/2022)   Received from [facility]    In the past 12 months, has lack of reliable transportation kept you from medical appointments, meetings, work or from getting things needed for daily living? : No  Physical Activity: Inactive (08/14/2020)   Received from [facility] visits prior to 04/06/2022., [facility] visits prior to 04/06/2022.   Exercise Vital Sign    Days of Exercise per Week: 0 days    Minutes of Exercise per Session: 0 min  Stress: Stress Concern Present (08/14/2020)   [facility] visits prior to 04/06/2022., [facility] visits prior to  04/06/2022.   [employer] of Occupational Health - Occupational Stress Questionnaire    Feeling of Stress : Very much  Social Connections: Moderately Isolated (08/14/2020)   [facility] visits prior to 04/06/2022., [facility] visits prior to 04/06/2022.   Social Advertising account executive [NHANES]    Frequency of Communication with Friends and Family: Once a week    Frequency of Social Gatherings with Friends and Family: Twice a week    Attends Religious Services: Never    Database administrator or Organizations: No    Attends Engineer, structural: Never    Marital Status: Married    Her Allergies Are:  Allergies  Allergen Reactions   Tegaderm Ag Mesh [Silver] Other (See Comments)    Skin irritation and itching   Tuberculin Ppd Hives and Swelling    Skin Test    Wound Dressing Adhesive     Skin irritation and itching   Sulfa Antibiotics Rash    Childhood reaction  :   Her Current Medications Are:  Outpatient Encounter Medications as of 11/19/2022  Medication Sig   EMGALITY 120 MG/ML SOAJ Inject into the skin every 30 (thirty) days.   esomeprazole (NEXIUM) 40 MG capsule Take 1 capsule (40 mg total) by mouth daily before breakfast. (Patient taking differently: Take 40 mg by mouth as needed.)   etonogestrel (NEXPLANON) 68 MG IMPL implant 68 mg by Subdermal route once.   hydrOXYzine (ATARAX) 25 MG tablet Take 25 mg by mouth as needed for anxiety.   loperamide (IMODIUM) 2 MG capsule as needed.   ondansetron (ZOFRAN-ODT) 4 MG disintegrating tablet as needed.   promethazine (PHENERGAN) 12.5 MG tablet Take 1 tablet (12.5 mg total) by mouth every 8 (eight) hours as needed for nausea or vomiting. May take 2 tablets if needed every 8 hours.   scopolamine (TRANSDERM-SCOP) 1 MG/3DAYS as needed.   Semaglutide,0.25 or 0.5MG /DOS, (OZEMPIC, 0.25 OR 0.5 MG/DOSE,) 2 MG/1.5ML SOPN 2 mg every week by sub-q route.   UBRELVY 50 MG  TABS 50 mg as needed.   [DISCONTINUED] buPROPion (WELLBUTRIN XL) 300 MG 24 hr tablet Take 300 mg by mouth daily. (Patient not taking: Reported on 09/05/2022)   [DISCONTINUED] dexmethylphenidate (FOCALIN XR) 10 MG 24 hr capsule Take 10 mg by mouth every morning. (Patient not taking: Reported on 09/05/2022)   [DISCONTINUED] FLUoxetine (PROZAC) 40 MG capsule Take 40 mg by mouth every morning. (Patient not taking: Reported on 09/05/2022)   [DISCONTINUED] propranolol (INDERAL) 20 MG tablet Take 20 mg by mouth 2 (two) times daily. (Patient not taking: Reported on 09/05/2022)   [DISCONTINUED] Rimegepant Sulfate (NURTEC) 75 MG TBDP Take 75 mg by mouth daily as needed (migraines). (Patient not taking: Reported on 11/19/2022)   [DISCONTINUED] tizanidine (ZANAFLEX) 2 MG capsule Take 2 mg by mouth 3 (three) times daily as needed for muscle spasms. (Patient not taking: Reported on 11/19/2022)   [DISCONTINUED] topiramate (TOPAMAX) 25 MG tablet Take 50 mg by mouth 2 (two) times daily. (Patient not taking: Reported on 09/05/2022)   No facility-administered encounter medications on file as of 11/19/2022.  :  Review of Systems:  Out of a complete 14 point review of systems, all are reviewed and negative with the exception of these symptoms as listed below:  Review of Systems  Neurological:        RM 9 alone Pt is well, reports she has been hiving headaches for 12-13 yrs. She currently has a headache all day every day. Associated dizziness, nausea, imbalance, brain fog, blurred vision, sensitivity to light/sound/motion/smell.  No onset triggers she is aware of.     Objective:  Neurological Exam  Physical Exam  Physical Examination:   Vitals:   11/19/22 1332  BP: 102/67  Pulse: 67    General Examination: The patient is a very pleasant 32 y.o. female in no acute distress. She appears well-developed and well-nourished and well groomed.   HEENT: Normocephalic, atraumatic, pupils are equal, round and reactive to  light, fundus optic exam benign.  No obvious photophobia.  Tympanic membranes clear on the left, partially obscured with cerumen on the right.  Hearing is grossly intact. Face is symmetric with normal facial animation. Speech is clear with no dysarthria noted. There is no hypophonia. There is no lip, neck/head, jaw or voice tremor. Neck is supple with full range of passive and active motion. There are no carotid bruits on auscultation. Oropharynx exam reveals: Mild mouth dryness, mild airway crowding, tongue protrudes centrally and palate elevates symmetrically.     Chest: Clear to auscultation without wheezing, rhonchi or crackles noted.   Heart: S1+S2+0, regular and normal without murmurs, rubs or gallops noted.    Abdomen: Soft, non-tender and non-distended.   Extremities: There is no pitting edema in the distal lower extremities bilaterally.    Skin: Warm and dry without trophic changes noted.    Musculoskeletal: exam reveals no obvious joint deformities.    Neurologically:  Mental status: The patient is awake, alert and oriented in all 4 spheres. Her immediate and remote memory, attention, language skills and fund of knowledge are appropriate. There is no evidence of aphasia, agnosia, apraxia or anomia. Speech is clear with normal prosody and enunciation. Thought process is linear. Mood is normal and affect is normal.  Cranial nerves II - XII are as described above under HEENT exam.  Motor exam: Normal bulk, strength and tone is noted. There is no drift, rebound, postural, action or resting tremor.  Fine motor skills and coordination: intact finger taps, hand movements and rapid alternating patting in both upper extremities, normal foot taps bilaterally in the lower extremities.  Cerebellar testing: No dysmetria or intention tremor. There is no truncal or gait ataxia.  Normal finger-to-nose, normal heel-to-shin bilaterally. Reflexes are 1+ throughout, toes are downgoing bilaterally. Romberg  is negative. Sensory exam: intact to light touch in the upper and lower extremities.  Gait, station and balance: She stands easily. No veering to one side is noted. No leaning to one side is noted. Posture is age-appropriate and stance is narrow based. Gait shows normal stride length and normal pace. No problems turning are noted.  Normal tandem walk.   Assessment and plan:    In summary, TMYA WIGINGTON is a 32 year old female with an underlying complex medical history of celiac disease, endometriosis, reflux disease, irritable bowel syndrome, vitamin D deficiency, vitamin B12 deficiency, anemia, migraine headaches, PCOS, palpitations, scoliosis, anxiety, depression, diabetes, and obesity, who presents for evaluation of her recurrent headaches of several years duration.  Headache syndrome may be more mixed type picture including migraine headaches, tension headaches, headaches from possible underlying sleep disordered breathing.  We talked about these different possibilities.  She is scheduled for sleep testing including a nocturnal polysomnogram with a next day nap study next month.  She is currently no longer on multiple of her medications, she has a psychiatrist but has not started the new psychotropic medication.  Neurological exam is nonfocal.  For migraine prevention she has previously tried propranolol, amitriptyline, and Topamax and is currently on Emgality injections as of last month.  She has undergone 1 injection thus far.  She has tried [employer]	[place] and Imitrex for  as needed use, currently on a prescription for Ubrelvy.  I believe her medication regimen is appropriate at this point.  She is encouraged to continue with Emgality injections, as needed use of Phenergan or Zofran, and as needed use of Ubrelvy.  She is reassured that her neurological exam is nonfocal, she is advised to stay well-hydrated and well rested and keep her eye appointment for December 2024.  She is reminded to make sure she gets a  full, dilated eye exam.  I would like to order a brain MRI with and without contrast to rule out a structural cause of her headaches.  She is agreeable.   She is advised to keep her appointment for sleep testing next month and we will plan a follow-up afterwards.  If she has obstructive sleep apnea, we will consider treatment with an AutoPap machine.  I answered all her questions today and she was in agreement with our plan.  I spent 40 minutes in total face-to-face time and in reviewing records during pre-charting, more than 50% of which was spent in counseling and coordination of care, reviewing test results, reviewing medications and treatment regimen and/or in discussing or reviewing the diagnosis of recurrent headaches including migraines, the prognosis and treatment options. Pertinent laboratory and imaging test results that were available during this visit with the patient were reviewed by me and considered in my medical decision making (see chart for details).

## 2022-11-21 ENCOUNTER — Encounter: Payer: Self-pay | Admitting: Hematology

## 2022-11-22 ENCOUNTER — Ambulatory Visit: Payer: Federal, State, Local not specified - PPO | Attending: Internal Medicine | Admitting: Internal Medicine

## 2022-11-22 ENCOUNTER — Encounter: Payer: Self-pay | Admitting: Internal Medicine

## 2022-11-22 ENCOUNTER — Ambulatory Visit: Payer: Federal, State, Local not specified - PPO

## 2022-11-22 ENCOUNTER — Ambulatory Visit: Payer: Federal, State, Local not specified - PPO | Admitting: Internal Medicine

## 2022-11-22 VITALS — BP 102/64 | HR 87 | Ht 64.0 in | Wt 196.0 lb

## 2022-11-22 DIAGNOSIS — R079 Chest pain, unspecified: Secondary | ICD-10-CM

## 2022-11-22 DIAGNOSIS — R002 Palpitations: Secondary | ICD-10-CM | POA: Diagnosis not present

## 2022-11-22 NOTE — Progress Notes (Signed)
Cardiology Office Note  Date: 11/22/2022   ID: Stephanie Acosta, Stephanie Acosta 11-29-1990, MRN [id number]  PCP:  Stephanie Luster, PA-C  Cardiologist:  Marjo Bicker, MD Electrophysiologist:  None   History of Present Illness: Stephanie Acosta is a 32 y.o. female known to have type 2 diabetes mellitus.  To cardiology clinic for evaluation of chest pain and palpitations.  Ongoing palpitations for many years, occurs multiple times throughout the day, feels like her heart skipping a beat.  Does not drink coffee.  She also has chest pains, sporadic, last between minutes and hours, occurs with both rest and exertion, no specific pattern she observed.  No family history of CAD in the immediate relatives.  No family history of sudden cardiac death.  No other symptoms of dizziness, syncope, DOE.  Performs household chores but does not exercise.  Past Medical History:  Diagnosis Date   Anxiety    Celiac disease 10/2013   Depression    Endometriosis    Fatigue    GERD (gastroesophageal reflux disease)    Headache syndrome    IBS (irritable bowel syndrome)    Iron deficiency anemia    Migraine    Palpitations    PCOS (polycystic ovarian syndrome) 2013   Pelvic pain in female    Scoliosis    Seasonal affective disorder (HCC)    Type 2 diabetes mellitus without complication, without long-term current use of insulin (HCC)    Wears contact lenses     Past Surgical History:  Procedure Laterality Date   BIOPSY  11/21/2021   Procedure: BIOPSY;  Surgeon: Lanelle Bal, DO;  [facility]: [facility];  Service: Endoscopy;;   CHOLECYSTECTOMY     COLONOSCOPY WITH PROPOFOL N/A 04/18/2022   Procedure: COLONOSCOPY WITH PROPOFOL;  Surgeon: Lanelle Bal, DO;  Location: [facility];  Service: Endoscopy;  Laterality: N/A;  10:30 AM, ASA 2   DILATION AND EVACUATION N/A 07/09/2012   Procedure: DILATATION AND EVACUATION;  Surgeon: Meriel Pica, MD;  Location: [facility];  Service: Gynecology;   Laterality: N/A;   ESOPHAGOGASTRODUODENOSCOPY (EGD) WITH PROPOFOL N/A 11/21/2021   Procedure: ESOPHAGOGASTRODUODENOSCOPY (EGD) WITH PROPOFOL;  Surgeon: Lanelle Bal, DO;  Location: [facility];  Service: Endoscopy;  Laterality: N/A;  12:45pm, asa 2   LAPAROSCOPY N/A 09/07/2014   Procedure: LAPAROSCOPY DIAGNOSTIC;  Surgeon: Richarda Overlie, MD;  Location[facility];  Service: Gynecology;  Laterality: N/A;   WISDOM TOOTH EXTRACTION      Current Outpatient Medications  Medication Sig Dispense Refill   EMGALITY 120 MG/ML SOAJ Inject into the skin every 30 (thirty) days.     esomeprazole (NEXIUM) 40 MG capsule Take 1 capsule (40 mg total) by mouth daily before breakfast. (Patient taking differently: Take 40 mg by mouth as needed.) 90 capsule 3   etonogestrel (NEXPLANON) 68 MG IMPL implant 68 mg by Subdermal route once.     hydrOXYzine (ATARAX) 25 MG tablet Take 25 mg by mouth as needed for anxiety.     loperamide (IMODIUM) 2 MG capsule as needed.     ondansetron (ZOFRAN-ODT) 4 MG disintegrating tablet as needed.     promethazine (PHENERGAN) 12.5 MG tablet Take 1 tablet (12.5 mg total) by mouth every 8 (eight) hours as needed for nausea or vomiting. May take 2 tablets if needed every 8 hours. 30 tablet 0   scopolamine (TRANSDERM-SCOP) 1 MG/3DAYS as needed.     Semaglutide,0.25 or 0.5MG /DOS, (OZEMPIC, 0.25 OR 0.5 MG/DOSE,)  2 MG/1.5ML SOPN 2 mg every week by sub-q route.     UBRELVY 50 MG TABS 50 mg as needed.     No current facility-administered medications for this visit.   Allergies:  Tegaderm ag mesh [silver], Tuberculin ppd, Wound dressing adhesive, and Sulfa antibiotics   Social History: The patient  reports that she has never smoked. She has never used smokeless tobacco. She reports current alcohol use. She reports that she does not use drugs.   Family History: The patient's family history includes Alcohol abuse in her maternal uncle, maternal uncle, maternal uncle,  maternal uncle, and mother; Bipolar disorder in her maternal aunt; Breast cancer in her maternal aunt; Colon cancer in her paternal grandfather; Colon polyps in her father; Crohn's disease in her mother; Depression in her mother; Diabetes in her maternal grandfather, paternal grandmother, and another family member; High Cholesterol in her father; Hypertension in her mother.   ROS:  Please see the history of present illness. Otherwise, complete review of systems is positive for none.  All other systems are reviewed and negative.   Physical Exam: VS:  BP 102/64   Pulse 87   Ht 5\' 4"  (1.626 m)   Wt 196 lb (88.9 kg)   SpO2 98%   BMI 33.64 kg/m , BMI Body mass index is 33.64 kg/m.  Wt Readings from Last 3 Encounters:  11/22/22 196 lb (88.9 kg)  11/19/22 199 lb (90.3 kg)  09/26/22 203 lb (92.1 kg)    General: Patient appears comfortable at rest. HEENT: Conjunctiva and lids normal, oropharynx clear with moist mucosa. Neck: Supple, no elevated JVP or carotid bruits, no thyromegaly. Lungs: Clear to auscultation, nonlabored breathing at rest. Cardiac: Regular rate and rhythm, no S3 or significant systolic murmur, no pericardial rub. Abdomen: Soft, nontender, no hepatomegaly, bowel sounds present, no guarding or rebound. Extremities: No pitting edema, distal pulses 2+. Skin: Warm and dry. Musculoskeletal: No kyphosis. Neuropsychiatric: Alert and oriented x3, affect grossly appropriate.  Recent Labwork: 04/19/2022: ALT 46; AST 54; BUN 13; Creatinine, Ser 0.94; Magnesium 1.9; Potassium 3.0; Sodium 132 08/29/2022: Hemoglobin 13.3; Platelets 319  No results found for: "CHOL", "TRIG", "HDL", "CHOLHDL", "VLDL", "LDLCALC", "LDLDIRECT"   Assessment and Plan:   Palpitations: Ongoing palpitations for many years, occurs multiple times throughout the day, feels like her heart is skipping a beat.  Does not drink coffee. Obtain 2-week event monitor.  Tried propranolol in the past but did not provide any  relief.  Chest pain: No specific pattern, sporadic episodes, lasting between minutes and hours, occurs with both rest and exertion. Obtain exercise tolerance test.   I spent a total duration of 30 minutes discussing her symptoms, face-to-face discussion/counseling of her problems, evaluation, management, ordering test, documenting the findings in the note.    Disposition:  Follow up pending results  Signed, Abrian Hanover Verne Spurr, MD, 11/22/2022 10:17 AM    [facility] [address] [place], [place] [address]

## 2022-11-22 NOTE — Patient Instructions (Signed)
Medication Instructions:  Your physician recommends that you continue on your current medications as directed. Please refer to the Current Medication list given to you today.  *If you need a refill on your cardiac medications before your next appointment, please call your pharmacy*   Lab Work: None If you have labs (blood work) drawn today and your tests are completely normal, you will receive your results only by: MyChart Message (if you have MyChart) OR A paper copy in the mail If you have any lab test that is abnormal or we need to change your treatment, we will call you to review the results.   Testing/Procedures: Your physician has requested that you have an exercise tolerance test. For further information please visit [email or web address] Please also follow instruction sheet, as given.    Follow-Up: At [facility], you and your health needs are our priority.  As part of our continuing mission to provide you with exceptional heart care, we have created designated Provider Care Teams.  These Care Teams include your primary Cardiologist (physician) and Advanced Practice Providers (APPs -  Physician Assistants and Nurse Practitioners) who all work together to provide you with the care you need, when you need it.  We recommend signing up for the patient portal called "MyChart".  Sign up information is provided on this After Visit Summary.  MyChart is used to connect with patients for Virtual Visits (Telemedicine).  Patients are able to view lab/test results, encounter notes, upcoming appointments, etc.  Non-urgent messages can be sent to your provider as well.   To learn more about what you can do with MyChart, go to [email or web address].    Your next appointment:    Pending testing results  Provider:   Luane School, MD    Other Instructions Christena Deem- Long Term Monitor Instructions   Your physician has requested you wear your ZIO patch monitor___14____days.   This  is a single patch monitor.  [employer] supplies one patch monitor per enrollment.  Additional stickers are not available.   Please do not apply patch if you will be having a Nuclear Stress Test, Echocardiogram, Cardiac CT, MRI, or Chest Xray during the time frame you would be wearing the monitor. The patch cannot be worn during these tests.  You cannot remove and re-apply the ZIO XT patch monitor.   Your ZIO patch monitor will be sent USPS Priority mail from [facility] directly to your home address. The monitor may also be mailed to a PO BOX if home delivery is not available.   It may take 3-5 days to receive your monitor after you have been enrolled.   Once you have received you monitor, please review enclosed instructions.  Your monitor has already been registered assigning a specific monitor serial # to you.   Applying the monitor   Shave hair from upper left chest.   Hold abrader disc by orange tab.  Rub abrader in 40 strokes over left upper chest as indicated in your monitor instructions.   Clean area with 4 enclosed alcohol pads .  Use all pads to assure are is cleaned thoroughly.  Let dry.   Apply patch as indicated in monitor instructions.  Patch will be place under collarbone on left side of chest with arrow pointing upward.   Rub patch adhesive wings for 2 minutes.Remove white label marked "1".  Remove white label marked "2".  Rub patch adhesive wings for 2 additional minutes.   While looking in a mirror, press and release  button in center of patch.  A small green light will flash 3-4 times .  This will be your only indicator the monitor has been turned on.     Do not shower for the first 24 hours.  You may shower after the first 24 hours.   Press button if you feel a symptom. You will hear a small click.  Record Date, Time and Symptom in the Patient Log Book.   When you are ready to remove patch, follow instructions on last 2 pages of Patient Log Book.  Stick patch monitor  onto last page of Patient Log Book.   Place Patient Log Book in [place] box.  Use locking tab on box and tape box closed securely.  The [employer] and [employer] has [employer] on it.  Please place in mailbox as soon as possible.  Your physician should have your test results approximately 7 days after the monitor has been mailed back to [facility].   Call [facility] Customer Care at [phone] if you have questions regarding your ZIO XT patch monitor.  Call them immediately if you see an orange light blinking on your monitor.   If your monitor falls off in less than 4 days contact our Monitor department at [phone].  If your monitor becomes loose or falls off after 4 days call [employer] at [phone] for suggestions on securing your monitor.

## 2022-11-26 DIAGNOSIS — R002 Palpitations: Secondary | ICD-10-CM

## 2022-11-28 ENCOUNTER — Telehealth: Payer: Self-pay | Admitting: Neurology

## 2022-11-28 NOTE — Telephone Encounter (Signed)
We left a voice mail for her to call us back to schedule the MRI on 10/21 and I called her again today but the voice mail was full.

## 2022-12-02 ENCOUNTER — Encounter (INDEPENDENT_AMBULATORY_CARE_PROVIDER_SITE_OTHER): Payer: Federal, State, Local not specified - PPO | Admitting: Internal Medicine

## 2022-12-02 ENCOUNTER — Encounter: Payer: Self-pay | Admitting: Hematology

## 2022-12-02 ENCOUNTER — Ambulatory Visit (HOSPITAL_COMMUNITY)
Admission: RE | Admit: 2022-12-02 | Discharge: 2022-12-02 | Disposition: A | Discharge status: Home / Self Care | Payer: Federal, State, Local not specified - PPO | Source: Ambulatory Visit | Attending: Family Medicine | Admitting: Family Medicine

## 2022-12-02 DIAGNOSIS — R079 Chest pain, unspecified: Secondary | ICD-10-CM | POA: Insufficient documentation

## 2022-12-02 DIAGNOSIS — R031 Nonspecific low blood-pressure reading: Secondary | ICD-10-CM

## 2022-12-02 DIAGNOSIS — Z9289 Personal history of other medical treatment: Secondary | ICD-10-CM

## 2022-12-02 LAB — EXERCISE TOLERANCE TEST
Angina Index: 0
Base ST Depression (mm): 0 mm
Duke Treadmill Score: 9
Estimated workload: 10.4
Exercise duration (min): 8 min
Exercise duration (sec): 32 s
MPHR: 188 {beats}/min
Peak HR: 169 {beats}/min
Percent HR: 89 %
RPE: 16
Rest HR: 82 {beats}/min
ST Depression (mm): 0 mm

## 2022-12-02 NOTE — Telephone Encounter (Signed)
[employer] fed submited request for an extension on auth can take up to 5-10 business days

## 2022-12-03 ENCOUNTER — Encounter (HOSPITAL_COMMUNITY): Payer: Federal, State, Local not specified - PPO

## 2022-12-03 NOTE — Telephone Encounter (Signed)
Please see the MyChart message reply(ies) for my assessment and plan.    This patient gave consent for this Medical Advice Message and is aware that it may result in a bill to [employer], as well as the possibility of receiving a bill for a co-payment or deductible. They are an established patient, but are not seeking medical advice exclusively about a problem treated during an in person or video visit in the last seven days. I did not recommend an in person or video visit within seven days of my reply.    I spent a total of 10 minutes cumulative time within 7 days through [employer].  Gisella Alwine Norton Pastel, MD

## 2022-12-03 NOTE — Telephone Encounter (Signed)
Updated insurnace info.  [employer] Berkley Harvey: [phone] (exp. 10/09/22 to 01/06/23) EE   Patient is scheduled at [facility] for 12/30/22 & 12/31/22.

## 2022-12-11 ENCOUNTER — Ambulatory Visit (INDEPENDENT_AMBULATORY_CARE_PROVIDER_SITE_OTHER): Payer: Federal, State, Local not specified - PPO

## 2022-12-11 ENCOUNTER — Encounter: Payer: Self-pay | Admitting: Neurology

## 2022-12-11 DIAGNOSIS — G4489 Other headache syndrome: Secondary | ICD-10-CM | POA: Diagnosis not present

## 2022-12-11 DIAGNOSIS — G43019 Migraine without aura, intractable, without status migrainosus: Secondary | ICD-10-CM

## 2022-12-11 DIAGNOSIS — R519 Headache, unspecified: Secondary | ICD-10-CM

## 2022-12-11 MED ORDER — GADOBENATE DIMEGLUMINE 529 MG/ML IV SOLN
15.0000 mL | Freq: Once | INTRAVENOUS | Status: AC | PRN
  Administered 2022-12-11: 15 mL via INTRAVENOUS

## 2022-12-16 NOTE — Telephone Encounter (Signed)
Pt called and LVM stating that she has seen her MRI results on Mychart and she is worried and would like to know if an RN can call her back to discuss.

## 2022-12-17 ENCOUNTER — Telehealth: Payer: Self-pay | Admitting: *Deleted

## 2022-12-17 NOTE — Telephone Encounter (Signed)
I called and left message that returned call and she can call back when convenient and we can give her th MRI results.

## 2022-12-17 NOTE — Telephone Encounter (Signed)
Pt returned call.  I gave her the results per Dr. Frances Furbish of the MRI Brain w/wo as per below.   Please call patient and advise her that her recent brain MRI did not show any acutely alarming findings, she has 1 spot in deep area of the brain on the left side which can be from a remote injury, also from migraine headaches, this type of spot can also be seen in patients with multiple sclerosis but no other findings suggest that she underlying multiple sclerosis.  The spot does not look recent or acute and does not pick up contrast material, which is a good sign.  We can consider a repeat brain MRI to see if it is stable in the near future, probably in 6 to 12 months.  Pt verbalized understanding.  She has a in lab sleep study 12-20-2022 due to fatigue.  She had cardiac monitor for chest pain/ heart palpitations.  She has googled about the MRI results, and is concerned relating to demyelinating possibility.  She states that she has had within the last year, increased headaches, tingling face, hands, feet, extreme clumsiness, hands shaking, dizziness/tinnitis.  She is asking if there are any other testing to determine concerning the demyelinating issue.  ( I mentioned that contrast in MRI (not enhancing) was good.  LP is one test that is done.  Im glad to relay her symptoms/ concerns with Dr. Frances Furbish.  She is out of office today and get back with her.  She appreciated call back and the information.

## 2022-12-18 ENCOUNTER — Encounter: Payer: Self-pay | Admitting: Internal Medicine

## 2022-12-18 NOTE — Telephone Encounter (Signed)
I will ask Dr. Epimenio Foot to also review the MRI and see if he has any further recommendations for [place].

## 2022-12-18 NOTE — Telephone Encounter (Signed)
LMVM for pt that calling her back.

## 2022-12-18 NOTE — Telephone Encounter (Signed)
Thank you very much Richard for your input.   Sandy, please relay our recommendations to patient that both Dr. Epimenio Foot (MS specialist) and myself recommend repeating her brain MRI in about 6 to 12 months, unless any new symptoms warrant sooner evaluation.

## 2022-12-18 NOTE — Telephone Encounter (Addendum)
Pt called back.  I relayed the note from Dr. Frances Furbish and also Dr. Epimenio Foot who is are MS Specialist. I relayed his and Dr. Teofilo Pod recommendation.  Repeat MRI 6-8-months, let us know of any new sx.  No additional testing.  She is to call back if any questions/ concerns. She verbalized understanding.

## 2022-12-30 ENCOUNTER — Ambulatory Visit (INDEPENDENT_AMBULATORY_CARE_PROVIDER_SITE_OTHER): Payer: Federal, State, Local not specified - PPO | Admitting: Neurology

## 2022-12-30 ENCOUNTER — Encounter: Payer: Self-pay | Admitting: Neurology

## 2022-12-30 DIAGNOSIS — R400 Somnolence: Secondary | ICD-10-CM

## 2022-12-30 DIAGNOSIS — G4710 Hypersomnia, unspecified: Secondary | ICD-10-CM

## 2022-12-30 DIAGNOSIS — E66811 Obesity, class 1: Secondary | ICD-10-CM

## 2022-12-30 DIAGNOSIS — R6889 Other general symptoms and signs: Secondary | ICD-10-CM

## 2022-12-30 DIAGNOSIS — G478 Other sleep disorders: Secondary | ICD-10-CM

## 2022-12-30 DIAGNOSIS — R519 Headache, unspecified: Secondary | ICD-10-CM

## 2022-12-30 DIAGNOSIS — G4720 Circadian rhythm sleep disorder, unspecified type: Secondary | ICD-10-CM

## 2022-12-31 ENCOUNTER — Other Ambulatory Visit: Payer: Self-pay | Admitting: *Deleted

## 2022-12-31 ENCOUNTER — Ambulatory Visit: Payer: Federal, State, Local not specified - PPO | Admitting: Neurology

## 2022-12-31 ENCOUNTER — Other Ambulatory Visit: Payer: Self-pay

## 2022-12-31 DIAGNOSIS — Z79899 Other long term (current) drug therapy: Secondary | ICD-10-CM

## 2022-12-31 DIAGNOSIS — R400 Somnolence: Secondary | ICD-10-CM

## 2022-12-31 DIAGNOSIS — E66811 Obesity, class 1: Secondary | ICD-10-CM

## 2022-12-31 DIAGNOSIS — R519 Headache, unspecified: Secondary | ICD-10-CM

## 2022-12-31 DIAGNOSIS — R6889 Other general symptoms and signs: Secondary | ICD-10-CM

## 2022-12-31 DIAGNOSIS — G478 Other sleep disorders: Secondary | ICD-10-CM

## 2022-12-31 DIAGNOSIS — G4710 Hypersomnia, unspecified: Secondary | ICD-10-CM

## 2022-12-31 NOTE — Telephone Encounter (Signed)
Pt is getting a sleep study right now. Will message patient later after 4 pm. Per Dr Frances Furbish, will advise patient that if she cannot wait on the current test results, like the sleep study, we recommend she see her primary care to discuss seeing another neurologist.

## 2022-12-31 NOTE — Telephone Encounter (Signed)
Please see repeat MyChart message. Please advise patient that at this point I recommend she talk to PCP about a referral to another neurologist, as I am not able to suggest anything else right now, other than what has been discussed repeatedly via phone, MyChart and during the appt.

## 2023-01-01 ENCOUNTER — Other Ambulatory Visit: Payer: Self-pay

## 2023-01-01 MED ORDER — DILTIAZEM HCL 30 MG PO TABS: 30.0000 mg | ORAL_TABLET | Freq: Three times a day (TID) | ORAL | 3 refills | Status: AC

## 2023-01-02 LAB — COMPREHENSIVE DRUG ANALYSIS,UR

## 2023-01-13 ENCOUNTER — Encounter: Payer: Self-pay | Admitting: Neurology

## 2023-01-13 NOTE — Procedures (Signed)
Physician Interpretation:     [facility] POLYSOMNOGRAPHY  INTERPRETATION REPORT   STUDY DATE:  12/30/2022     PATIENT NAME:  Griselda Ropp         DATE OF BIRTH:  02-Sep-1990  PATIENT ID:  [id number]    TYPE OF STUDY:  PSG  READING PHYSICIAN: Huston Foley, MD, PhD   SCORING TECHNICIAN: Domingo Cocking, RPSGT   Referred by: Odis Luster, PA-C  ? History and Indication for Testing: 32 year old female with an underlying medical history of diabetes, iron deficiency anemia, vitamin B12 and vitamin D deficiencies, celiac disease, endometriosis, anxiety, depression, reflux disease, headaches, irritable bowel syndrome, PCOS, palpitations, scoliosis, and obesity, who reports significant daytime somnolence for the past several years. She presents for a nocturnal polysomnogram with next day MSLT.  The Epworth Sleepiness Scale endorsed at at  17 /24 points. Height: 60 in Weight: 199 lb (BMI 38) Neck Size: 15 in    MEDICATIONS: Emgality, Nexium, Nexplanon, Atarax, Imodium, Zofran, Phenergan, Transderm-SCOP, Ozempic   TECHNICAL DESCRIPTION: A registered sleep technologist was in attendance for the duration of the recording.  Data collection, scoring, video monitoring, and reporting were performed in compliance with the AASM Manual for the Scoring of Sleep and Associated Events; (Hypopnea is scored based on the criteria listed in Section VIII D. 1b in the AASM Manual V2.6 using a 4% oxygen desaturation rule or Hypopnea is scored based on the criteria listed in Section VIII D. 1a in the AASM Manual V2.6 using 3% oxygen desaturation and /or arousal rule).   SLEEP CONTINUITY AND SLEEP ARCHITECTURE:  Lights-out was at 21:58: and lights-on at  06:07:, with a total recording time of 8 hours, 9.5 min. Total sleep time ( TST) was 452.0 minutes with a normal sleep efficiency at 92.3%. There was  21.6% REM sleep.  BODY POSITION:  TST was divided  between the following sleep  positions: 92.9% supine;  7.1% lateral;  0% prone. Duration of total sleep and percent of total sleep in their respective position is as follows: supine 420 minutes (93%), non-supine 32 minutes (7%); right 32 minutes (7%), left 00 minutes (0%), and prone 00 minutes (0%).  Total supine REM sleep time was 97 minutes (100% of total REM sleep).  Sleep latency was normal at 12.0 minutes.  REM sleep latency was mildly below normal at 62.5 minutes. Of the total sleep time, the percentage of stage N1 sleep was 5.0%, stage N2 sleep was 61%, which is mildly increased, stage N3 sleep was 12.5%, which is mildly reduced, and REM sleep was 21.6%, which is normal. Wake after sleep onset (WASO) time accounted for 25.5 minutes with minimal and intermittent sleep fragmentation noted.   RESPIRATORY MONITORING:   Based on CMS criteria (using a 4% oxygen desaturation rule for scoring hypopneas), there were 1 apneas (0 obstructive; 0 central; 1 mixed), and 0 hypopneas.  Apnea index was 0.1. Hypopnea index was 0.0. The apnea-hypopnea index was 0.1 overall (0.1 supine, 0 non-supine; 0.6 REM, 0.6 supine REM).  There were 0 respiratory effort-related arousals (RERAs).  The RERA index was 0 events/h. Total respiratory disturbance index (RDI) was 0.1 events/h. RDI results showed: supine RDI  0.1 /h; non-supine RDI 0.0 /h; REM RDI 0.6 /h, supine REM RDI 0.6 /h.   Based on AASM criteria (using a 3% oxygen desaturation and /or arousal rule for scoring hypopneas), there were 1 apneas (0 obstructive; 0 central; 1 mixed), and 2 hypopneas. Apnea index was 0.1.  Hypopnea index was 0.3. The apnea-hypopnea index was 0.4/hour overall (0.4 supine, 0 non-supine; 1.8 REM, 1.8 supine REM).  There were 0 respiratory effort-related arousals (RERAs).  The RERA index was 0 events/h. Total respiratory disturbance index (RDI) was 0.4 events/h. RDI results showed: supine RDI  0.4 /h; non-supine RDI 0.0 /h; REM RDI 1.8 /h, supine REM RDI 1.8 /h.  OXIMETRY:  Oxyhemoglobin Saturation Nadir during sleep was at  91% from a mean of 96%.  Of the Total sleep time (TST)   hypoxemia (=<88%) was present for  0.1 minutes, or 0.0% of total sleep time.  LIMB MOVEMENTS: There were 18 periodic limb movements of sleep (2.4/hr), of which 3 (0.4/hr) were associated with an arousal.   AROUSAL: There were 45 arousals in total, for an arousal index of 6 arousals/hour.  Of these, 0 were identified as respiratory-related arousals (0 /h), 3 were PLM-related arousals (0 /h), and 41 were non-specific arousals (5 /h).   EEG: Review of the EEG showed no abnormal electrical discharges and symmetrical bihemispheric findings.    EKG: The EKG revealed normal sinus rhythm (NSR). The average heart rate during sleep was 90 bpm.  AUDIO/VIDEO REVIEW: The audio and video review did not show any abnormal or unusual behaviors, movements, phonations or vocalizations. The patient took no restroom breaks. No audible snoring was noted.  POST-STUDY QUESTIONNAIRE: Post study, the patient indicated, that sleep was the same as usual.  The patient had a nap study, MSLT, next day on 12/31/2022 with a mean sleep latency of 12 minutes for 5 naps and 0 SOREMPs (sleep onset REM periods) noted.  This indicates mild but nonspecific sleepiness, diagnostic criteria for idiopathic hypersomnolence or narcolepsy are not met.  See separate MSLT report for details.    IMPRESSION:   1. Dysfunctions associated with sleep stages or arousal from sleep 2. Nonspecific daytime somnolence  RECOMMENDATIONS:   1. The overnight polysomnogram from 12/30/2022 does not demonstrate any significant obstructive or central sleep disordered breathing.  Her AHI was 0.4/hour, O2 nadir 91%. No significant snoring was detected. 2. This study shows minimal sleep fragmentation and very mildly abnormal sleep stage percentages; these are nonspecific findings and per se do not signify an intrinsic sleep disorder or a cause for the  patient's sleep-related symptoms. Causes include (but are not limited to) the first night effect of the sleep study, circadian rhythm disturbances, medication effect or an underlying mood disorder or medical problem.  3. The nocturnal PSG and next day MSLT indicate mild degree of daytime somnolence. Diagnostic criteria for narcolepsy or idiopathic hypersomnolence are not met.  Clinical correlation is recommended. 4. The patient should be cautioned not to drive, work at heights, or operate dangerous or heavy equipment when tired or sleepy. Review and reiteration of good sleep hygiene measures should be pursued with any patient. 5. The patient will be seen in follow-up by Dr. Frances Furbish at [facility] for discussion of the test results and further management strategies. The referring provider will be notified of the test results. I certify that I have reviewed the entire raw data recording prior to the issuance of this report in accordance with the Standards of Accreditation of the American Academy of Sleep Medicine (AASM).  Huston Foley, MD, PhD Medical Director, [facility]) Diplomat, ABPN (Neurology and Sleep)               Technical Report:   General Information  Name: Joi, Dasgupta BMI: 56.21 Physician: Huston Foley,  MD  ID: [id number] Height: 60.0 in Technician: Domingo Cocking, RPSGT  Sex: Female Weight: 199.0 lb Record: [id number]  Age: 21 [04/12/1990] Date: 12/30/2022    Medical & Medication History    Ms. Gruenberg is a 32 year old female with an underlying medical history of diabetes, iron deficiency anemia, vitamin B12 and vitamin D deficiencies, celiac disease, endometriosis, anxiety, depression, reflux disease, headaches, irritable bowel syndrome, PCOS, palpitations, scoliosis, and obesity, who reports significant daytime somnolence for the past several years. She has had daytime sleepiness for at least 12 years, first noticed significant  somnolence when she was pregnant with her daughter. She is not aware of any snoring. She has vivid dreams, she also dreams when she naps. She has had rare instances of sleep paralysis but denies any telltale symptoms of cataplexy. She may have had hypnopompic hallucinations. She is never fully rested and even when she tries to get enough sleep, she can still nap during the day. She is sleepy at the wheel and avoids long distance driving, they do have a [id number] commute but since her husband works with her at the same workplace, he usually does the driving. 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 Epworth sleepiness score is 17 out of 24, fatigue severity score is 63 out of 63. Bedtime is generally between 9 and 10 and rise time around 6 AM. Left to her own devices she can easily sleep 10 to 12 hours. She lives with her family including husband and 2 year old and 54-year-old. She has had recurrent morning headaches, denies nightly nocturia. Her mom is sleepy during the day but no one has been formally diagnosed with sleep apnea in her family or sleepiness condition. She is a non-smoker. She drinks caffeine once or twice a week, she drinks alcohol less than once a month.  Emgality, Nexium, Nexplanon, Atarax, Imodium, Zofran, Phenergan, Transderm-SCOP, Ozempic   Sleep Disorder      Comments   Patient arrived for a diagnostic polysomnogram, to be followed by an MSLT if warranted. Procedure explained and all questions answered. Standard paste setup without complications. Patient slept supine and right. No audible snoring was noted. No significant respiratory events observed. After 2 hours total sleep time, AHI = 0.0. No obvious cardiac arrhythmias noted. PLMS observed. No nocturia.    Lights out: 09:58:30 PM Lights on: 06:07:59 AM   Time Total Supine Side Prone Upright  Recording (TRT) 8h 9.43m 7h 30.47m 0h 39.57m 0h 0.33m 0h 0.92m  Sleep (TST) 7h 32.37m 7h 0.41m 0h 32.109m 0h 0.39m 0h 0.49m   Latency N1 N2 N3  REM Onset Per. Slp. Eff.  Actual 0h 0.82m 0h 3.1m 0h 25.15m 1h 2.51m 0h 12.76m 0h 20.20m 92.34%   Stg Dur Wake N1 N2 N3 REM  Total 37.5 22.5 275.5 56.5 97.5  Supine 30.5 15.0 251.0 56.5 97.5  Side 7.0 7.5 24.5 0.0 0.0  Prone 0.0 0.0 0.0 0.0 0.0  Upright 0.0 0.0 0.0 0.0 0.0   Stg % Wake N1 N2 N3 REM  Total 7.7 5.0 61.0 12.5 21.6  Supine 6.2 3.3 55.5 12.5 21.6  Side 1.4 1.7 5.4 0.0 0.0  Prone 0.0 0.0 0.0 0.0 0.0  Upright 0.0 0.0 0.0 0.0 0.0     Apnea Summary Sub Supine Side Prone Upright  Total 1 Total 1 1 0 0 0    REM 1 1 0 0 0    NREM 0 0 0 0 0  Obs 0 REM 0 0 0 0 0  NREM 0 0 0 0 0  Mix 1 REM 1 1 0 0 0    NREM 0 0 0 0 0  Cen 0 REM 0 0 0 0 0    NREM 0 0 0 0 0   Rera Summary Sub Supine Side Prone Upright  Total 0 Total 0 0 0 0 0    REM 0 0 0 0 0    NREM 0 0 0 0 0   Hypopnea Summary Sub Supine Side Prone Upright  Total 2 Total 2 2 0 0 0    REM 2 2 0 0 0    NREM 0 0 0 0 0   4% Hypopnea Summary Sub Supine Side Prone Upright  Total (4%) 0 Total 0 0 0 0 0    REM 0 0 0 0 0    NREM 0 0 0 0 0     AHI Total Obs Mix Cen  0.40 Apnea 0.13 0.00 0.13 0.00   Hypopnea 0.27 -- -- --  0.13 Hypopnea (4%) 0.00 -- -- --    Total Supine Side Prone Upright  Position AHI 0.40 0.43 0.00 0.00 0.00  REM AHI 1.85   NREM AHI 0.00   Position RDI 0.40 0.43 0.00 0.00 0.00  REM RDI 1.85   NREM RDI 0.00    4% Hypopnea Total Supine Side Prone Upright  Position AHI (4%) 0.13 0.14 0.00 0.00 0.00  REM AHI (4%) 0.62   NREM AHI (4%) 0.00   Position RDI (4%) 0.13 0.14 0.00 0.00 0.00  REM RDI (4%) 0.62   NREM RDI (4%) 0.00    Desaturation Information Threshold: 2% <100% <90% <80% <70% <60% <50% <40%  Supine 53.0 0.0 0.0 0.0 0.0 0.0 0.0  Side 0.0 0.0 0.0 0.0 0.0 0.0 0.0  Prone 0.0 0.0 0.0 0.0 0.0 0.0 0.0  Upright 0.0 0.0 0.0 0.0 0.0 0.0 0.0  Total 53.0 0.0 0.0 0.0 0.0 0.0 0.0  Index 6.7 0.0 0.0 0.0 0.0 0.0 0.0   Threshold: 3% <100% <90% <80% <70% <60% <50% <40%  Supine 9.0 0.0 0.0 0.0 0.0  0.0 0.0  Side 0.0 0.0 0.0 0.0 0.0 0.0 0.0  Prone 0.0 0.0 0.0 0.0 0.0 0.0 0.0  Upright 0.0 0.0 0.0 0.0 0.0 0.0 0.0  Total 9.0 0.0 0.0 0.0 0.0 0.0 0.0  Index 1.1 0.0 0.0 0.0 0.0 0.0 0.0   Threshold: 4% <100% <90% <80% <70% <60% <50% <40%  Supine 1.0 0.0 0.0 0.0 0.0 0.0 0.0  Side 0.0 0.0 0.0 0.0 0.0 0.0 0.0  Prone 0.0 0.0 0.0 0.0 0.0 0.0 0.0  Upright 0.0 0.0 0.0 0.0 0.0 0.0 0.0  Total 1.0 0.0 0.0 0.0 0.0 0.0 0.0  Index 0.1 0.0 0.0 0.0 0.0 0.0 0.0   Threshold: 3% <100% <90% <80% <70% <60% <50% <40%  Supine 9 0 0 0 0 0 0  Side 0 0 0 0 0 0 0  Prone 0 0 0 0 0 0 0  Upright 0 0 0 0 0 0 0  Total 9 0 0 0 0 0 0   Awakening/Arousal Information # of Awakenings 16  Wake after sleep onset 25.66m  Wake after persistent sleep 24.52m   Arousal Assoc. Arousals Index  Apneas 0 0.0  Hypopneas 0 0.0  Leg Movements 16 2.1  Snore 0 0.0  PTT Arousals 0 0.0  Spontaneous 41 5.4  Total 57 7.6  Leg Movement Information PLMS LMs Index  Total LMs during PLMS  18 2.4  LMs w/ Microarousals 3 0.4   LM LMs Index  w/ Microarousal 13 1.7  w/ Awakening 4 0.5  w/ Resp Event 0 0.0  Spontaneous 21 2.8  Total 34 4.5     Desaturation threshold setting: 3% Minimum desaturation setting: 10 seconds SaO2 nadir: 91% The longest event was a 17 sec obstructive Hypopnea with a minimum SaO2 of 94%. The lowest SaO2 was 93% associated with a 12 sec obstructive Hypopnea. EKG Rates EKG Avg Max Min  Awake 94 105 79  Asleep 90 105 78  EKG Events: Tachycardia

## 2023-01-13 NOTE — Procedures (Signed)
Physician Interpretation:   [facility] MSLT Report    Name: Stephanie Acosta, Stephanie Acosta BJY:[id number]  Study Date: 12/31/2022 Procedure #: 0 DOB: 04/21/1990  Referred by: Odis Luster, PA-C  ? History and Indication for Testing: 32 year old female with an underlying medical history of diabetes, iron deficiency anemia, vitamin B12 and vitamin D deficiencies, celiac disease, endometriosis, anxiety, depression, reflux disease, headaches, irritable bowel syndrome, PCOS, palpitations, scoliosis, and obesity, who reports significant daytime somnolence for the past several years. She presents for a nocturnal polysomnogram (on 12/30/22) with next day MSLT.  The Epworth Sleepiness Scale endorsed at at  17 /24 points. Height: 60 in Weight: 199 lb (BMI 38) Neck Size: 15 in. A  UDS on 12/31/22 was negative.   Protocol This is a 13 channel Multiple Sleep Latency Test comprised of 5 channels of EEG (T3-Cz, Cz-T4, F4-M1, C4-M1, O2-M1). 3 channels of Chin EMG, 4 channels of EOG and 1 channel for ECG. All channels were sampled at 256 Hz.  This polysomnographic procedure is designed to evaluate (1) the complaint of excessive daytime sleepiness by quantifying the time required to fall asleep and (2) the possibility of narcolepsy by checking for abnormally short latencies to REM sleep. Electrographic variables include EEG, EMG, EOG and ECG. Patients are monitored throughout four or five 20-minute opportunities to sleep (naps) at two-hour intervals. For each nap, the patient is allowed 20 minutes to fall asleep. Once asleep, the patient is awakened after 15 minutes. Between naps, the patient is kept as alert as possible. A sleep latency of 20 minutes indicates that no sleep occurred. Parametric Analysis Total Number of Naps 5   NAP # Time of Nap Sleep Latency (mins) REM Latency (mins) Sleep Time Percent Awake Time Percent   1 08:24 10.0 20.0 55 45    2 10:15 9.5 20.0 61 39    3 12:31  9.0 20.0 63 38    4 14:21 20.0 20.0 0 100    5 16:03 11.5 20.0 38 62     MSLT Summary of Naps  Sleepiness Index: 40.0  Mean Sleep Latency: 12.0  Number of Naps with REM Sleep: 0   Results from Preceding PSG Study  Sleep Onset Time 22:10 Sleep Efficiency (%) 92.34%  Rise Time 06:07 Sleep Latency (min)  Total Sleep Time 7h REM Latency (min) 1h 2.65min   Name: Stephanie Acosta, Stephanie Acosta Reference 0  Study Date: 12/31/2022 Procedure #: 0 DOB: [birth year]   INTERPRETATION:   The patient had a nap study, MSLT, on 12/31/2022 with a mean sleep latency of 12 minutes for 5 naps and 0 SOREMPs (sleep onset REM periods) noted.  This indicates mild but nonspecific sleepiness, diagnostic criteria for idiopathic hypersomnolence or narcolepsy are not met.  See separate PSG for details.    IMPRESSION:  1. Dysfunctions associated with sleep stages or arousal from sleep 2. Nonspecific daytime somnolence  RECOMMENDATIONS:  1. The overnight polysomnogram from 12/30/2022 does not demonstrate any significant obstructive or central sleep disordered breathing.  Her AHI was 0.4/hour, O2 nadir 91%. No significant snoring was detected. 2. The overnight NPSG shows minimal sleep fragmentation and very mildly abnormal sleep stage percentages; these are nonspecific findings and per se do not signify an intrinsic sleep disorder or a cause for the patient's sleep-related symptoms. Causes include (but are not limited to) the first night effect of the sleep study, circadian rhythm disturbances, medication effect or an underlying mood disorder or medical problem.  3. The nocturnal  PSG and next day MSLT indicate mild degree of daytime somnolence. Diagnostic criteria for narcolepsy or idiopathic hypersomnolence are not met.  Clinical correlation is recommended. 4. The patient should be cautioned not to drive, work at heights, or operate dangerous or heavy equipment when tired or sleepy. Review and reiteration of good sleep  hygiene measures should be pursued with any patient. 5. The patient will be seen in follow-up by Dr. Frances Furbish at [facility] for discussion of the test results and further management strategies. The referring provider will be notified of the test results. I certify that I have reviewed the entire raw data recording prior to the issuance of this report in accordance with the Standards of Accreditation of the American Academy of Sleep Medicine (AASM).  Huston Foley, MD, PhD Medical Director, [facility]) Diplomat, ABPN (Neurology and Sleep)   Technical Report:   General Information  Name: Stephanie Acosta, Stephanie Acosta BMI: 84 Physician: Huston Foley, MD  ID: [id number] Height: 60 in Technician: Marianne Sofia  Sex: Female Weight: 199 lb Record: [id number]  Age: 56 [Apr 19, 1990] Date: 12/31/2022 Scorer: Irene Pap, RPSGT   Nap 1 Nap Start: 08:24:21 AM Nap End: 08:49:03 AM    Latency to Sleep Onset: 10.32m Total Sleep Time: 13.29m Stg Dur REM N1 N2 N3   0.68m 3.82m 10.2m 0.55m    Nap 2 Nap Start: 10:15:39 AM Nap End: 10:40:08 AM    Latency to Sleep Onset: 9.73m Total Sleep Time: 15.47m Stg Dur REM N1 N2 N3   0.35m 2.84m 13.58m 0.32m    Nap 3 Nap Start: 12:31:07 PM Nap End: 12:55:02 PM    Latency to Sleep Onset: 9.83m Total Sleep Time: 15.72m Stg Dur REM N1 N2 N3   0.57m 2.58m 10.28m 2.56m    Nap 4 Nap Start: 02:21:36 PM Nap End: 02:41:30 PM    Latency to Sleep Onset: 20.15m Total Sleep Time: 0.79m Stg Dur REM N1 N2 N3   0.39m 0.92m 0.63m 0.9m    Nap 5 Nap Start: 04:03:26 PM Nap End: 04:30:01 PM    Latency to Sleep Onset: 11.35m Total Sleep Time: 10.51m Stg Dur REM N1 N2 N3   0.45m 2.109m 8.69m 0.51m    Mean Sleep Latency: 12.48m Number of Sleep Onset REM Periods: 0

## 2023-01-14 ENCOUNTER — Telehealth: Payer: Self-pay | Admitting: *Deleted

## 2023-01-14 NOTE — Telephone Encounter (Signed)
-----   Message from Huston Foley sent at 01/13/2023  1:29 PM EST ----- Please call patient and advise her that her recent sleep study from 12/30/22 and next day nap study from 12/31/22 showed mild daytime sleepiness, not in the realm of narcolepsy or what we call idiopathic hypersomnolence. She achieved all stages of sleep during the night time sleep study and does not have any sleep apnea. She can FU routinely in 2-3 months with me for FU for her HAs. Please remind her not to drive or use heavy machinery or dangerous equipment with feeling sleepy.

## 2023-02-01 ENCOUNTER — Other Ambulatory Visit: Payer: Self-pay | Admitting: Gastroenterology

## 2023-02-03 NOTE — Telephone Encounter (Signed)
PT NEEDS OV

## 2023-02-20 ENCOUNTER — Ambulatory Visit: Payer: Federal, State, Local not specified - PPO | Admitting: Gastroenterology

## 2023-02-20 ENCOUNTER — Encounter: Payer: Self-pay | Admitting: Gastroenterology

## 2023-02-20 ENCOUNTER — Encounter: Payer: Self-pay | Admitting: *Deleted

## 2023-02-20 VITALS — BP 99/60 | HR 88 | Temp 98.4°F | Ht 64.0 in | Wt 191.5 lb

## 2023-02-20 DIAGNOSIS — K219 Gastro-esophageal reflux disease without esophagitis: Secondary | ICD-10-CM | POA: Diagnosis not present

## 2023-02-20 DIAGNOSIS — D509 Iron deficiency anemia, unspecified: Secondary | ICD-10-CM

## 2023-02-20 DIAGNOSIS — R110 Nausea: Secondary | ICD-10-CM | POA: Insufficient documentation

## 2023-02-20 DIAGNOSIS — R109 Unspecified abdominal pain: Secondary | ICD-10-CM

## 2023-02-20 MED ORDER — PROMETHAZINE HCL 12.5 MG PO TABS: 12.5000 mg | ORAL_TABLET | Freq: Three times a day (TID) | ORAL | 1 refills | Status: AC | PRN

## 2023-02-20 MED ORDER — SCOPOLAMINE 1 MG/3DAYS TD PT72: 1.0000 | MEDICATED_PATCH | TRANSDERMAL | 1 refills | Status: AC

## 2023-02-20 MED ORDER — HYOSCYAMINE SULFATE 0.125 MG PO TBDP: 0.1250 mg | ORAL_TABLET | Freq: Four times a day (QID) | ORAL | 3 refills | Status: AC | PRN

## 2023-02-20 MED ORDER — ONDANSETRON 4 MG PO TBDP: 4.0000 mg | ORAL_TABLET | Freq: Three times a day (TID) | ORAL | 3 refills | Status: AC | PRN

## 2023-02-20 NOTE — Progress Notes (Signed)
Gastroenterology Office Note     Primary Care Physician:  Clemetine Marker  Primary Gastroenterologist: Dr. Marletta Lor   Chief Complaint   Chief Complaint  Patient presents with   Follow-up    Pt arrives for refills. Pt reports more stomach issues within the past 2 weeks. Pt has had nausea. A lot of stomach cramping and pain. Pain in general abdominal area. Zofran, Phenergan and Patches need refills     History of Present Illness   Stephanie Acosta is a 33 y.o. female presenting today with a history of GERD, IBS, PCOS, anxiety, nausea, IDA, concern for possible celiac disease in 2015 at outside GI practice but now with negative celiac serologies and benign duodenal biopsies.    Was taking nexium but was expensive. Will take pepto prn. Past 2 weeks has had to take 5-6 times. Prior to this, once every 2-3 weeks. No vomiting. Will have days with no nausea and then some with nausea. Will take Zofran if needed. If escalated, will take phenergan. Will sometimes use scopolamine patch. More days with nausea than without. Nexium did not help with nausea.   Pantoprazole caused nausea, omeprazole without effect, has famotidine at home and takes at times without much difference. Zegerid at home which may or may not work. Past 3 nights in bathroom for hours. Feels pain, cramping, hot flashes occurring in middle of night. Happened 2 other times over past few weeks. Pain is moving around. Feels better after BM but not having diarrhea. Just one BM. No constipation. No rectal bleeding. Has been on hyoscamine in the past with improvement. On Ozempic a year.   Followed by Hematology for IDA. No recent iron infusions. Last a year ago. No menstrual cycle.   Her most pressing issue is the abdominal cramping. She feels nausea is controlled.   Nov 2024 labs: Hgb 12.9, previously 13.3, iron low at 46, ferritin down to 52, previously 103. Initially at 5 a year ago.     Colonoscopy March 2024: normal  colon, normal TI.   EGD 11/21/2021: -Normal esophagus -Normal stomach -Normal duodenum s/p biopsies to assess for celiac -Benign duodenal biopsies, negative for villous abnormality or intraepithelial lymphocytosis   Sept 2015 EGD: flattened mucosa in duodenum suspicious for celiac disease s/p biopsy. Path with patchy minimal duodenal intraepithelial lymphocytosis, preservation of villous architecture. Unable to rule out celiac disease but could also be related to food hypersensitivity, NSAIDs, H.pylori, etc.   Past Medical History:  Diagnosis Date   Anxiety    Celiac disease 10/2013   Depression    Endometriosis    Fatigue    GERD (gastroesophageal reflux disease)    Headache syndrome    IBS (irritable bowel syndrome)    Iron deficiency anemia    Migraine    Palpitations    PCOS (polycystic ovarian syndrome) 2013   Pelvic pain in female    Scoliosis    Seasonal affective disorder (HCC)    Type 2 diabetes mellitus without complication, without long-term current use of insulin (HCC)    Wears contact lenses     Past Surgical History:  Procedure Laterality Date   BIOPSY  11/21/2021   Procedure: BIOPSY;  Surgeon: Lanelle Bal, DO;  Location: [facility];  Service: Endoscopy;;   CHOLECYSTECTOMY     COLONOSCOPY WITH PROPOFOL N/A 04/18/2022   Procedure: COLONOSCOPY WITH PROPOFOL;  Surgeon: Lanelle Bal, DO;  Location: [facility];  Service: Endoscopy;  Laterality: N/A;  10:30  AM, ASA 2   DILATION AND EVACUATION N/A 07/09/2012   Procedure: DILATATION AND EVACUATION;  Surgeon: Meriel Pica, MD;  Location: [facility];  Service: Gynecology;  Laterality: N/A;   ESOPHAGOGASTRODUODENOSCOPY (EGD) WITH PROPOFOL N/A 11/21/2021   Procedure: ESOPHAGOGASTRODUODENOSCOPY (EGD) WITH PROPOFOL;  Surgeon: Lanelle Bal, DO;  Location: [facility];  Service: Endoscopy;  Laterality: N/A;  12:45pm, asa 2   LAPAROSCOPY N/A 09/07/2014   Procedure: LAPAROSCOPY DIAGNOSTIC;  Surgeon:  Richarda Overlie, MD;  Location: [facility];  Service: Gynecology;  Laterality: N/A;   WISDOM TOOTH EXTRACTION      Current Outpatient Medications  Medication Sig Dispense Refill   diltiazem (CARDIZEM) 30 MG tablet Take 1 tablet (30 mg total) by mouth every 8 (eight) hours. 270 tablet 3   EMGALITY 120 MG/ML SOAJ Inject into the skin every 30 (thirty) days.     etonogestrel (NEXPLANON) 68 MG IMPL implant 68 mg by Subdermal route once.     loperamide (IMODIUM) 2 MG capsule as needed.     ondansetron (ZOFRAN-ODT) 4 MG disintegrating tablet as needed.     promethazine (PHENERGAN) 12.5 MG tablet Take 1 tablet (12.5 mg total) by mouth every 8 (eight) hours as needed for nausea or vomiting. May take 2 tablets if needed every 8 hours. 30 tablet 0   scopolamine (TRANSDERM-SCOP) 1 MG/3DAYS APPLY 1 PATCH(1.5 MG) TOPICALLY TO THE SKIN EVERY 3 DAYS 4 patch 0   Semaglutide,0.25 or 0.5MG /DOS, (OZEMPIC, 0.25 OR 0.5 MG/DOSE,) 2 MG/1.5ML SOPN 2 mg every week by sub-q route.     UBRELVY 50 MG TABS 50 mg as needed.     Vitamin D, Ergocalciferol, (DRISDOL) 1.25 MG (50000 UNIT) CAPS capsule Take 1 capsule by mouth once a week.     hydrOXYzine (ATARAX) 25 MG tablet Take 25 mg by mouth as needed for anxiety. (Patient not taking: Reported on 02/20/2023)     No current facility-administered medications for this visit.    Allergies as of 02/20/2023 - Review Complete 02/20/2023  Allergen Reaction Noted   Tegaderm ag mesh [silver] Other (See Comments) 04/18/2022   Tuberculin ppd Hives and Swelling 09/27/2020   Wound dressing adhesive  04/18/2022   Sulfa antibiotics Rash 10/26/2010    Family History  Problem Relation Age of Onset   Hypertension Mother    Depression Mother    Alcohol abuse Mother    Crohn's disease Mother    Colon polyps Father    High Cholesterol Father    Diabetes Maternal Grandfather    Diabetes Paternal Grandmother    Colon cancer Paternal Grandfather    Bipolar disorder  Maternal Aunt    Breast cancer Maternal Aunt    Alcohol abuse Maternal Uncle    Alcohol abuse Maternal Uncle    Alcohol abuse Maternal Uncle    Alcohol abuse Maternal Uncle    Diabetes Other        "everyone"   Sleep apnea Neg Hx     Social History   Socioeconomic History   Marital status: Married    Spouse name: Not on file   Number of children: Not on file   Years of education: Not on file   Highest education level: Not on file  Occupational History   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 [place]  Tobacco Use   Smoking status: Never   Smokeless tobacco: Never  Vaping Use   Vaping status: Never Used  Substance and Sexual Activity   Alcohol use: Yes  Comment: <1 per month   Drug use: Never   Sexual activity: Yes    Birth control/protection: Implant  Other Topics Concern   Not on file  Social History Narrative   Oct 2023: 2 kids, ages 29 year old girl and 40-year-old boy      Update 09/26/2022   Caffeine: 1-2 drinks/week   Left handed   Lives at home with husband and kids   Social Drivers of Health   Financial Resource Strain: Low Risk  (08/14/2020)   Received from [facility] visits prior to 04/06/2022., [facility] visits prior to 04/06/2022.   Overall Financial Resource Strain (CARDIA)    Difficulty of Paying Living Expenses: Not hard at all  Food Insecurity: Low Risk  (10/23/2022)   [facility]   Hunger Vital Sign    Worried About Running Out of Food in the Last Year: Never true    Ran Out of Food in the Last Year: Never true  Transportation Needs: No Transportation Needs (10/23/2022)   Received from [facility]    In the past 12 months, has lack of reliable transportation kept you from medical appointments, meetings, work or from getting things needed for daily living? : No  Physical Activity: Inactive (08/14/2020)   Received from [facility] visits prior to  04/06/2022., [facility] visits prior to 04/06/2022.   Exercise Vital Sign    Days of Exercise per Week: 0 days    Minutes of Exercise per Session: 0 min  Stress: Stress Concern Present (08/14/2020)   [facility] prior to 04/06/2022., [facility] visits prior to 04/06/2022.   [employer] of Occupational Health - Occupational Stress Questionnaire    Feeling of Stress : Very much  Social Connections: Moderately Isolated (08/14/2020)   Received from [facility] visits prior to 04/06/2022., [facility] visits prior to 04/06/2022.   Social Advertising account executive [NHANES]    Frequency of Communication with Friends and Family: Once a week    Frequency of Social Gatherings with Friends and Family: Twice a week    Attends Religious Services: Never    Database administrator or Organizations: No    Attends Banker Meetings: Never    Marital Status: Married  Catering manager Violence: Not At Risk (04/01/2022)   Humiliation, Afraid, Rape, and Kick questionnaire    Fear of Current or Ex-Partner: No    Emotionally Abused: No    Physically Abused: No    Sexually Abused: No     Review of Systems   Gen: Denies any fever, chills, fatigue, weight loss, lack of appetite.  CV: Denies chest pain, heart palpitations, peripheral edema, syncope.  Resp: Denies shortness of breath at rest or with exertion. Denies wheezing or cough.  GI: Denies dysphagia or odynophagia. Denies jaundice, hematemesis, fecal incontinence. GU : Denies urinary burning, urinary frequency, urinary hesitancy MS: Denies joint pain, muscle weakness, cramps, or limitation of movement.  Derm: Denies rash, itching, dry skin Psych: Denies depression, anxiety, memory loss, and confusion Heme: Denies bruising, bleeding, and enlarged lymph nodes.   Physical Exam   BP 99/60   Pulse 88   Temp 98.4  F (36.9 C)   Ht 5\' 4"  (1.626 m)   Wt 191 lb 8 oz (86.9 kg)   BMI 32.87 kg/m  General:  Alert and oriented. Pleasant and cooperative. Well-nourished and well-developed.  Head:  Normocephalic and atraumatic. Eyes:  Without icterus Abdomen:  +BS, soft, non-tender and non-distended. No HSM noted. No guarding or rebound. No masses appreciated.  Rectal:  Deferred  Msk:  Symmetrical without gross deformities. Normal posture. Extremities:  Without edema. Neurologic:  Alert and  oriented x4;  grossly normal neurologically. Skin:  Intact without significant lesions or rashes. Psych:  Alert and cooperative. Normal mood and affect.   Assessment   SAHITHI BOWERMASTER is a 33 y.o. female presenting today with a history of  GERD, IBS, PCOS, anxiety, nausea, IDA, concern for possible celiac disease in 2015 at outside GI practice but now with negative celiac serologies and benign duodenal biopsies.   IDA: colonoscopy/EGD on file unrevealing. I note she received iron infusions last year. No menstrual cycle. As she is having drifting Hgb and ferritin dropping again, will order capsule study to conclude evaluation. Agile capsule first.   GERD: failing many prescriptive and OTC. Recommend famotidine.   Chronic nausea: controlled overall with Zofran, Phenergan, rarely a patch. This has been chronic and likely multifactorial. Suspect delayed gastric emptying in light of DM although A1c is well-controlled, med effect, (Ozempic), or idiopathic. Holding off on GES unless unable to tolerate diet.       PLAN    Agile study. If negative, then capsule Anti-emetics refilled Close follow-up in 3 months   Gelene Mink, PhD, [facility] Gastroenterology

## 2023-02-20 NOTE — Patient Instructions (Signed)
I have refilled your nausea medication.  I sent in hyoscyamine to take under your tongue every 6 hours as needed for abdominal cramping.  To wrap up the evaluation for IDA, we will do a capsule study of your small intestines! First, we will do a "dummy" capsule, to make sure it passes through ok.  We will see you in 3 months! Please message if no significant improvement or worsening!  I enjoyed seeing you again today! I value our relationship and want to provide genuine, compassionate, and quality care. You may receive a survey regarding your visit with me, and I welcome your feedback! Thanks so much for taking the time to complete this. I look forward to seeing you again.      Gelene Mink, PhD, ANP-BC [facility] Gastroenterology

## 2023-03-04 ENCOUNTER — Encounter: Payer: Self-pay | Admitting: Physician Assistant

## 2023-03-07 ENCOUNTER — Other Ambulatory Visit: Payer: Self-pay

## 2023-03-07 DIAGNOSIS — D649 Anemia, unspecified: Secondary | ICD-10-CM

## 2023-03-07 DIAGNOSIS — D509 Iron deficiency anemia, unspecified: Secondary | ICD-10-CM

## 2023-03-10 ENCOUNTER — Inpatient Hospital Stay: Payer: Federal, State, Local not specified - PPO

## 2023-03-10 NOTE — Telephone Encounter (Signed)
 Thank you :)

## 2023-03-12 ENCOUNTER — Encounter (HOSPITAL_COMMUNITY): Payer: Self-pay

## 2023-03-12 ENCOUNTER — Ambulatory Visit (HOSPITAL_COMMUNITY): Admit: 2023-03-12 | Payer: Federal, State, Local not specified - PPO | Admitting: Internal Medicine

## 2023-03-12 SURGERY — AGILE CAPSULE

## 2023-03-13 ENCOUNTER — Inpatient Hospital Stay: Payer: Federal, State, Local not specified - PPO | Admitting: Oncology

## 2023-04-01 ENCOUNTER — Encounter: Payer: Self-pay | Admitting: Internal Medicine

## 2023-05-05 ENCOUNTER — Ambulatory Visit: Payer: Federal, State, Local not specified - PPO | Admitting: Internal Medicine

## 2023-05-28 ENCOUNTER — Ambulatory Visit: Payer: Federal, State, Local not specified - PPO | Admitting: Gastroenterology

## 2024-02-10 ENCOUNTER — Encounter: Payer: Self-pay | Admitting: Oncology
